# Patient Record
Sex: Female | Born: 1950 | Race: White | Hispanic: No | Marital: Single | Smoking: Never smoker
Health system: Southern US, Community
[De-identification: ages and names within clinical notes are randomized; demographics above are authoritative.]

## PROBLEM LIST (undated history)

## (undated) DIAGNOSIS — C801 Malignant (primary) neoplasm, unspecified: Secondary | ICD-10-CM

## (undated) DIAGNOSIS — M549 Dorsalgia, unspecified: Secondary | ICD-10-CM

## (undated) DIAGNOSIS — I10 Essential (primary) hypertension: Secondary | ICD-10-CM

## (undated) DIAGNOSIS — H9190 Unspecified hearing loss, unspecified ear: Secondary | ICD-10-CM

## (undated) DIAGNOSIS — G8929 Other chronic pain: Secondary | ICD-10-CM

## (undated) DIAGNOSIS — F419 Anxiety disorder, unspecified: Secondary | ICD-10-CM

## (undated) HISTORY — PX: BREAST BIOPSY: SHX20

## (undated) HISTORY — DX: Anxiety disorder, unspecified: F41.9

## (undated) HISTORY — DX: Essential (primary) hypertension: I10

## (undated) HISTORY — DX: Other chronic pain: G89.29

## (undated) HISTORY — PX: BREAST EXCISIONAL BIOPSY: SUR124

## (undated) HISTORY — DX: Dorsalgia, unspecified: M54.9

## (undated) HISTORY — DX: Malignant (primary) neoplasm, unspecified: C80.1

## (undated) HISTORY — PX: BREAST LUMPECTOMY: SHX2

---

## 1991-01-23 DIAGNOSIS — C801 Malignant (primary) neoplasm, unspecified: Secondary | ICD-10-CM

## 1991-01-23 DIAGNOSIS — G8929 Other chronic pain: Secondary | ICD-10-CM

## 1991-01-23 HISTORY — DX: Malignant (primary) neoplasm, unspecified: C80.1

## 1991-01-23 HISTORY — DX: Other chronic pain: G89.29

## 1998-05-10 ENCOUNTER — Other Ambulatory Visit: Admission: RE | Admit: 1998-05-10 | Discharge: 1998-05-10 | Payer: Self-pay | Admitting: *Deleted

## 1999-02-07 ENCOUNTER — Encounter: Payer: Self-pay | Admitting: Gastroenterology

## 1999-02-07 ENCOUNTER — Encounter: Admission: RE | Admit: 1999-02-07 | Discharge: 1999-02-07 | Payer: Self-pay | Admitting: Gastroenterology

## 2003-12-09 ENCOUNTER — Encounter (INDEPENDENT_AMBULATORY_CARE_PROVIDER_SITE_OTHER): Payer: Self-pay | Admitting: *Deleted

## 2003-12-09 ENCOUNTER — Ambulatory Visit (HOSPITAL_COMMUNITY): Admission: RE | Admit: 2003-12-09 | Discharge: 2003-12-09 | Payer: Self-pay | Admitting: Gastroenterology

## 2004-11-17 ENCOUNTER — Encounter: Admission: RE | Admit: 2004-11-17 | Discharge: 2005-01-21 | Payer: Self-pay | Admitting: Family Medicine

## 2008-08-20 ENCOUNTER — Other Ambulatory Visit: Admission: RE | Admit: 2008-08-20 | Discharge: 2008-08-20 | Payer: Self-pay | Admitting: Family Medicine

## 2010-04-04 ENCOUNTER — Other Ambulatory Visit: Payer: Self-pay | Admitting: Otolaryngology

## 2010-04-04 DIAGNOSIS — R42 Dizziness and giddiness: Secondary | ICD-10-CM

## 2010-06-09 NOTE — Op Note (Signed)
NAMEADDISON, Debbie [place]                  ACCOUNT NO.:  [id number]   MEDICAL RECORD NO.:  [id number]          PATIENT TYPE:  AMB   LOCATION:  ENDO                         FACILITY:  [facility]   PHYSICIAN:  Petra Kuba, M.D.    DATE OF BIRTH:  10/02/1950   DATE OF PROCEDURE:  12/09/2003  DATE OF DISCHARGE:                                 OPERATIVE REPORT   PROCEDURE:  Esophagogastroduodenoscopy.   INDICATIONS:  Upper tract symptoms.  Consent was signed after risks,  benefits, methods, and options thoroughly discussed multiple times in the  past.   Additional medicines for this procedure:  Demerol 10 mg, Versed 3 mg.   PROCEDURE:  The video endoscope was inserted by direct vision.  Proximal and  midesophagus were normal.  In the mid-distal esophagus was a small hiatal  hernia with a widely patent ring.  The scope passed into the stomach and  advanced through the antrum, pertinent for some minimal antritis, through a  normal pylorus, into a normal duodenal bulb and around the C-loop to a  normal second portion of the duodenum.  The scope was withdrawn back to the  bulb and a good look there ruled out ulcers in that location.  The scope was  withdrawn back to the stomach and retroflexed.  High in the cardia the  hiatal hernia was confirmed.  The angularis, fundus, lesser and greater  curve were normal on retroflex visualization.  Straight visualization in the  stomach revealed some minimal antritis and gastritis as mentioned above but  no other abnormalities.  Air was suctioned and the scope slowly withdrawn.  Again a good look at the esophagus confirmed the above findings.  The scope  was removed.  The patient tolerated the procedure well.  There was no  obvious immediate complication.   ENDOSCOPIC DIAGNOSES:  1.  Small hiatal hernia with a widely patent ring.  2.  Minimal gastritis and antritis.  3.  Otherwise normal EGD.   PLAN:  H2 blockers or pump inhibitors.  Redilation p.r.n.  Happy  to see back  p.r.n.  Otherwise, return care to Dr. Idell Pickles for the customary health  screening and maintenance to include yearly rectals and guaiacs.      MEM/MEDQ  D:  12/09/2003  T:  12/09/2003  Job:  [id number]   cc:   Dellis Anes. Idell Pickles, M.D.  [address]  [place]  [place] [address]  Fax: [phone]

## 2010-06-09 NOTE — Op Note (Signed)
NAMESHALOM, WARE                  ACCOUNT NO.:  [id number]   MEDICAL RECORD NO.:  [id number]          PATIENT TYPE:  AMB   LOCATION:  ENDO                         FACILITY:  [facility]   PHYSICIAN:  Petra Kuba, M.D.    DATE OF BIRTH:  10-Jun-1950   DATE OF PROCEDURE:  12/09/2003  DATE OF DISCHARGE:                                 OPERATIVE REPORT   PROCEDURE:  Colonoscopy.   INDICATIONS:  Patient with history of breast cancer, due for colonic  screening.  Consent was signed after risks, benefits, methods, and options  thoroughly discussed in the office on multiple occasions.   MEDICINES USED:  Demerol 90 mg, Versed 9 mg.   PROCEDURE:  Rectal inspection was pertinent for external hemorrhoids.  Digital exam was negative.  The video pediatric adjustable colonoscope was  inserted and with abdominal pressure, despite a tortuous sigmoid, was able  to be advanced to the cecum.  No position change was needed.  No abnormality  but left-sided tics were seen on insertion.  The cecum was identified by the  appendiceal orifice and the ileocecal valve.  In fact, the scope was  inserted a short way into the terminal ileum, which was normal.  Photo  documentation was obtained.  The scope was slowly withdrawn.  Prep was  adequate.  There was some liquid stool that required washing and suctioning,  but on slow withdrawal through the colon three questionable polyps versus  fold edema were seen and were all cold biopsied and put in separate  containers.  These were in the ascending, midtransverse, and proximal  sigmoid.  None were worrisome-looking.  All were probably just scope trauma  or prep-induced edema.  Other than the left-sided diverticula, no other  abnormalities were seen as we slowly withdrew back to the rectum.  Anorectal  pull-through and retroflexion confirmed some small hemorrhoids.  The scope  was straightened, air was suctioned, and the scope removed.  The patient  tolerated the  procedure well.  There was no obvious immediate complication.   ENDOSCOPIC DIAGNOSES:  1.  Internal-external small hemorrhoids.  2.  Left-sided diverticula.  3.  Tortuous sigmoid.  4.  Three questionable edematous folds, took biopsies to rule out polyps, in      the ascending, transverse, and      sigmoid.  5.  Otherwise within normal limits to the terminal ileum.   PLAN:  Await pathology to determine future colonic screening.  Continue  workup with an EGD.      MEM/MEDQ  D:  12/09/2003  T:  12/09/2003  Job:  [id number]   cc:   Dellis Anes. Idell Pickles, M.D.  [address]  [place]  [place] [address]  Fax: [phone]

## 2010-06-22 ENCOUNTER — Other Ambulatory Visit: Payer: Self-pay | Admitting: Family Medicine

## 2010-06-22 ENCOUNTER — Other Ambulatory Visit (HOSPITAL_BASED_OUTPATIENT_CLINIC_OR_DEPARTMENT_OTHER): Payer: Self-pay | Admitting: Family Medicine

## 2010-06-22 DIAGNOSIS — Z1231 Encounter for screening mammogram for malignant neoplasm of breast: Secondary | ICD-10-CM

## 2010-07-10 ENCOUNTER — Ambulatory Visit (HOSPITAL_BASED_OUTPATIENT_CLINIC_OR_DEPARTMENT_OTHER): Payer: Self-pay

## 2010-07-11 ENCOUNTER — Ambulatory Visit: Payer: Self-pay

## 2010-07-19 ENCOUNTER — Ambulatory Visit (HOSPITAL_BASED_OUTPATIENT_CLINIC_OR_DEPARTMENT_OTHER)
Admission: RE | Admit: 2010-07-19 | Discharge: 2010-07-19 | Disposition: A | Discharge status: Home / Self Care | Payer: Medicare Other | Source: Ambulatory Visit | Attending: Family Medicine | Admitting: Family Medicine

## 2010-07-19 DIAGNOSIS — Z1231 Encounter for screening mammogram for malignant neoplasm of breast: Secondary | ICD-10-CM | POA: Insufficient documentation

## 2011-08-01 ENCOUNTER — Other Ambulatory Visit (HOSPITAL_BASED_OUTPATIENT_CLINIC_OR_DEPARTMENT_OTHER): Payer: Self-pay | Admitting: Family Medicine

## 2011-08-01 DIAGNOSIS — Z1231 Encounter for screening mammogram for malignant neoplasm of breast: Secondary | ICD-10-CM

## 2011-08-16 ENCOUNTER — Ambulatory Visit (HOSPITAL_BASED_OUTPATIENT_CLINIC_OR_DEPARTMENT_OTHER)
Admission: RE | Admit: 2011-08-16 | Discharge: 2011-08-16 | Disposition: A | Discharge status: Home / Self Care | Payer: Medicare Other | Source: Ambulatory Visit | Attending: Family Medicine | Admitting: Family Medicine

## 2011-08-16 DIAGNOSIS — Z1231 Encounter for screening mammogram for malignant neoplasm of breast: Secondary | ICD-10-CM | POA: Insufficient documentation

## 2012-07-22 ENCOUNTER — Other Ambulatory Visit (HOSPITAL_BASED_OUTPATIENT_CLINIC_OR_DEPARTMENT_OTHER): Payer: Self-pay | Admitting: Family Medicine

## 2012-07-22 DIAGNOSIS — Z1231 Encounter for screening mammogram for malignant neoplasm of breast: Secondary | ICD-10-CM

## 2012-08-18 ENCOUNTER — Ambulatory Visit (HOSPITAL_BASED_OUTPATIENT_CLINIC_OR_DEPARTMENT_OTHER)
Admission: RE | Admit: 2012-08-18 | Discharge: 2012-08-18 | Disposition: A | Discharge status: Home / Self Care | Payer: Medicare Other | Source: Ambulatory Visit | Attending: Family Medicine | Admitting: Family Medicine

## 2012-08-18 DIAGNOSIS — Z1231 Encounter for screening mammogram for malignant neoplasm of breast: Secondary | ICD-10-CM

## 2013-07-17 ENCOUNTER — Other Ambulatory Visit (HOSPITAL_BASED_OUTPATIENT_CLINIC_OR_DEPARTMENT_OTHER): Payer: Self-pay | Admitting: Family Medicine

## 2013-07-17 DIAGNOSIS — Z1231 Encounter for screening mammogram for malignant neoplasm of breast: Secondary | ICD-10-CM

## 2013-08-24 ENCOUNTER — Ambulatory Visit (HOSPITAL_BASED_OUTPATIENT_CLINIC_OR_DEPARTMENT_OTHER)
Admission: RE | Admit: 2013-08-24 | Discharge: 2013-08-24 | Disposition: A | Discharge status: Home / Self Care | Payer: Medicare Other | Source: Ambulatory Visit | Attending: Family Medicine | Admitting: Family Medicine

## 2013-08-24 DIAGNOSIS — Z1231 Encounter for screening mammogram for malignant neoplasm of breast: Secondary | ICD-10-CM | POA: Insufficient documentation

## 2014-06-18 ENCOUNTER — Other Ambulatory Visit: Payer: Self-pay | Admitting: Family Medicine

## 2014-06-18 ENCOUNTER — Ambulatory Visit
Admission: RE | Admit: 2014-06-18 | Discharge: 2014-06-18 | Disposition: A | Discharge status: Home / Self Care | Payer: Medicare Other | Source: Ambulatory Visit | Attending: Family Medicine | Admitting: Family Medicine

## 2014-06-18 DIAGNOSIS — M25512 Pain in left shoulder: Secondary | ICD-10-CM

## 2014-07-29 ENCOUNTER — Ambulatory Visit: Payer: Medicare Other | Attending: Family Medicine | Admitting: Rehabilitation

## 2014-07-29 DIAGNOSIS — R29898 Other symptoms and signs involving the musculoskeletal system: Secondary | ICD-10-CM | POA: Insufficient documentation

## 2014-07-29 DIAGNOSIS — M542 Cervicalgia: Secondary | ICD-10-CM

## 2014-07-29 DIAGNOSIS — M5010 Cervical disc disorder with radiculopathy, unspecified cervical region: Secondary | ICD-10-CM

## 2014-07-29 NOTE — Therapy (Signed)
[address] [place], [place], [address] Phone: [phone]   Fax:  [phone]  Physical Therapy Evaluation  Patient Details  Name: Debbie [place] MRN: [id number] Date of Birth: 09/21/1950 Referring Provider:  Hulan Fess, MD  Encounter Date: 07/29/2014      PT End of Session - 07/29/14 1008    Visit Number 1   PT Start Time 1010   PT Stop Time 1110   PT Time Calculation (min) 60 min      No past medical history on file.  No past surgical history on file.  There were no vitals filed for this visit.  Visit Diagnosis:  Cervical disc disorder with radiculopathy of cervical region - Plan: PT plan of care cert/re-cert  Cervicalgia - Plan: PT plan of care cert/re-cert  Decreased range of motion of neck - Plan: PT plan of care cert/re-cert      Subjective Assessment - 07/29/14 1003    Subjective L neck pain since 11/15, no apparent reason/injury.   Normal cspine xray w/ normal disc space height.   Dx w/ cervical radiculopathy.    Sees chiropractor 2x/month for manipulation, stretching   Patient is accompained by: Interpreter   Pertinent History deafness, osteopenia w/ -2 Tscore Lspine/hip, h/o R breast CA s/p mastectomy1993 and normal breast MRI 2009, anxiety, h/o cardiac dysrhythmia   Diagnostic tests negative xrays   Currently in Pain? Yes   Pain Score 3    Pain Location Neck   Pain Orientation Left   Pain Descriptors / Indicators Pressure   Pain Radiating Towards L upper trap   Pain Onset More than a month ago   Pain Frequency Intermittent   Aggravating Factors  yardwork, any lifting   Pain Relieving Factors lying down   Effect of Pain on Daily Activities any ADL requiring lifting or arm overhead            [facility] PT Assessment - 07/29/14 0001    Assessment   Medical Diagnosis cervical radiculopathy   Onset Date/Surgical Date 12/07/14   Balance Screen   Has the patient fallen in the past  6 months No   ROM / Strength   AROM / PROM / Strength AROM;Strength   AROM   AROM Assessment Site Cervical   Cervical Flexion 100%   Cervical Extension 50%   Cervical - Right Side Bend 60%   Cervical - Left Side Bend 40%   Cervical - Right Rotation 100%   Cervical - Left Rotation 100%   Strength   Overall Strength Comments B shoulder:  4/5 flex, Abd, ER, HABD   Special Tests    Special Tests Cervical   Cervical Tests Spurling's   Spurling's   Comment very mild positive on L, neg R                   [facility] Adult PT Treatment/Exercise - 07/29/14 0001    Modalities   Modalities Ultrasound   Ultrasound   Ultrasound Location L cspine   Ultrasound Parameters 100 x 1 mhz x 1.5 w/cm2 x 10'   Ultrasound Goals Pain                  PT Short Term Goals - 07/29/14 1008    PT SHORT TERM GOAL #1   Title independent with HEP   Baseline 4   Period Weeks   Status New   PT SHORT TERM GOAL #2   Title  cervical spine ROM of 75% all directions   Time 4   Period Weeks   Status New           PT Long Term Goals - 08-25-2014 1008    PT LONG TERM GOAL #1   Title 2/10 pain in neck at worst with no radiculopathy   Time 8   Period Weeks   Status New   PT LONG TERM GOAL #2   Title 75% subjective improvement in symptoms   Time 8   Period Weeks   Status New               Plan - 25-Aug-2014 1125    Clinical Impression Statement Pt. only has pain in L upper trap area.    She is not tender to palpate over the UT, but does have palpable trigger pts over suboccipital muscles and L posterior scalene mid belly.   Severe forward head/rounded shoulder, some thoracic kyphosis.   She postures habitually in L sidebending/R rotation upper cervical spine.  She has chronic LBP/scoliosis/osteopenia per her report.  She is actually very interested in a spinomed type postural brace so we will check on referral to [facility] for this.   Pt will benefit from skilled therapeutic  intervention in order to improve on the following deficits Pain;Postural dysfunction;Decreased range of motion;Decreased strength   Rehab Potential Good   PT Frequency 2x / week   PT Duration 8 weeks   PT Treatment/Interventions Electrical Stimulation;Cryotherapy;Moist Heat;Traction;Ultrasound;Therapeutic exercise;Patient/family education;Manual techniques;Dry needling;Taping   PT Next Visit Plan scap/cervical retraction strength,  Combo, portable cervical traction unit, subocc release MFR, R spine sideglide mobilizations          G-Codes - 08/25/14 1130    Functional Assessment Tool Used pt can't complete       Problem List There are no active problems to display for this patient.   Volney American, PT 08/25/2014, 11:31 AM  [address] [address] [place], [place], [address] Phone: [phone]   Fax:  [phone]

## 2014-08-02 ENCOUNTER — Ambulatory Visit: Payer: Medicare Other | Admitting: Rehabilitation

## 2014-08-02 DIAGNOSIS — R29898 Other symptoms and signs involving the musculoskeletal system: Secondary | ICD-10-CM

## 2014-08-02 DIAGNOSIS — M5010 Cervical disc disorder with radiculopathy, unspecified cervical region: Secondary | ICD-10-CM | POA: Diagnosis not present

## 2014-08-02 DIAGNOSIS — M542 Cervicalgia: Secondary | ICD-10-CM

## 2014-08-02 NOTE — Therapy (Signed)
[place], [place], [address] Phone: [phone]   Fax:  [phone]  Physical Therapy Treatment  Patient Details  Name: Debbie [place] MRN: [id number] Date of Birth: 1950-07-05 Referring Provider:  Hulan Fess, MD  Encounter Date: 08/02/2014      PT End of Session - 08/02/14 0839    Visit Number 2   PT Start Time 0802   PT Stop Time 0902   PT Time Calculation (min) 60 min      No past medical history on file.  No past surgical history on file.  There were no vitals filed for this visit.  Visit Diagnosis:  Cervicalgia  Decreased range of motion of neck      Subjective Assessment - 08/02/14 0842    Subjective States feels about the same.   Wants to know if ok to mow and weed eat.   Currently in Pain? Yes   Pain Score 3    Pain Location Neck   Pain Orientation Left   Pain Descriptors / Indicators Aching;Sore                         [facility] Adult PT Treatment/Exercise - 08/02/14 0001    Modalities   Modalities Electrical Stimulation;Moist Heat;Ultrasound   Moist Heat Therapy   Number Minutes Moist Heat 15 Minutes   Moist Heat Location Cervical   Electrical Stimulation   Electrical Stimulation Location L cervical/UT   Electrical Stimulation Parameters premod x 8 mA   Electrical Stimulation Goals Pain   Ultrasound   Ultrasound Location L cspine   Ultrasound Parameters Combo x 100% x 39mhz x 1.5 w/cm2 x 90 mV Hivolt x 10'   Ultrasound Goals Pain   Manual Therapy   Manual Therapy Joint mobilization;Myofascial release   Manual therapy comments subocc rls, trigger pt release UT and post scalene   Joint Mobilization grade 3 cervical bilat sideglides C2-7 x 30 ea;   L rotation Gr 3 x 30 ea level,                PT Education - 08/02/14 0842    Education provided Yes   Education Details pain is her guide on all activity   Person(s) Educated Patient   Methods  Explanation   Comprehension Verbalized understanding          PT Short Term Goals - 07/29/14 1008    PT SHORT TERM GOAL #1   Title independent with HEP   Baseline 4   Period Weeks   Status New   PT SHORT TERM GOAL #2   Title cervical spine ROM of 75% all directions   Time 4   Period Weeks   Status New           PT Long Term Goals - 07/29/14 1008    PT LONG TERM GOAL #1   Title 2/10 pain in neck at worst with no radiculopathy   Time 8   Period Weeks   Status New   PT LONG TERM GOAL #2   Title 75% subjective improvement in symptoms   Time 8   Period Weeks   Status New               Plan - 08/02/14 9518    Clinical Impression Statement Pt. received suboccipital release, trigger point release, and cervical SB MFR stretch.   Tolerates well.    She is very interested  in a supportive TLSO for her kyphoscoliosis.   Called Reggie @ [employer] re: [employer].    They will need MD Order and pt. to call and schedule   PT Next Visit Plan continue combo, MFR.   Add UBE and TB scap.  Send order for TLSO to MD for signature, and then to [employer] and advise pt. to make apptmt.         Problem List There are no active problems to display for this patient.   Volney American, PT 08/02/2014, 12:17 PM  [address] [place], [place], [address] Phone: [phone]   Fax:  [phone]

## 2014-08-04 ENCOUNTER — Ambulatory Visit: Payer: Medicare Other | Admitting: Rehabilitation

## 2014-08-04 DIAGNOSIS — M5010 Cervical disc disorder with radiculopathy, unspecified cervical region: Secondary | ICD-10-CM | POA: Diagnosis not present

## 2014-08-04 DIAGNOSIS — R29898 Other symptoms and signs involving the musculoskeletal system: Secondary | ICD-10-CM

## 2014-08-04 DIAGNOSIS — M542 Cervicalgia: Secondary | ICD-10-CM

## 2014-08-04 NOTE — Therapy (Signed)
[place], [place], [address] Phone: [phone]   Fax:  [phone]  Physical Therapy Treatment  Patient Details  Name: Debbie [place] MRN: [id number] Date of Birth: 1950-05-24 Referring Provider:  Hulan Fess, MD  Encounter Date: 08/04/2014      PT End of Session - 08/04/14 0804    Visit Number 3   PT Start Time 7782   PT Stop Time 0900   PT Time Calculation (min) 65 min      No past medical history on file.  No past surgical history on file.  There were no vitals filed for this visit.  Visit Diagnosis:  Cervicalgia  Decreased range of motion of neck      Subjective Assessment - 08/04/14 1124    Subjective States feels about the same regarding her neck pain, but that feels better after therapy   Currently in Pain? Yes   Pain Score 3    Pain Location Neck   Pain Orientation Left   Pain Descriptors / Indicators Aching                         [facility] Adult PT Treatment/Exercise - 08/04/14 0001    Exercises   Exercises Neck   Neck Exercises: Machines for Strengthening   UBE (Upper Arm Bike) 6' L1 back only  6' L1 back only   Neck Exercises: Theraband   Scapula Retraction Limitations x 20 in supine   Rows Limitations 15# x 20   Shoulder External Rotation Limitations supine yell TB x 20 BUE   Horizontal ABduction Limitations supine yell TB x 20 BUE   Neck Exercises: Supine   Neck Retraction Limitations x20   Other Supine Exercise scapular depression x 20 bue   Modalities   Modalities Traction   Ultrasound   Ultrasound Location L cspine   Ultrasound Parameters Combo x 100% x 1 mhz x 1.5 w/cm2 x 10'   Ultrasound Goals Pain   Traction   Type of Traction Cervical   Max (lbs) 20   Hold Time static   Time 12                  PT Short Term Goals - 07/29/14 1008    PT SHORT TERM GOAL #1   Title independent with HEP   Baseline 4   Period Weeks   Status New   PT SHORT TERM GOAL #2   Title cervical spine ROM of 75% all directions   Time 4   Period Weeks   Status New           PT Long Term Goals - 07/29/14 1008    PT LONG TERM GOAL #1   Title 2/10 pain in neck at worst with no radiculopathy   Time 8   Period Weeks   Status New   PT LONG TERM GOAL #2   Title 75% subjective improvement in symptoms   Time 8   Period Weeks   Status New               Plan - 08/04/14 1129    Clinical Impression Statement Lots of time spent reviewing HEP and making sure patient is doing correctly.   Pt. has lots of questions and lots of extra time required due to interpretation via sign langauge interpreter.   We have faxed an order for off the shelf TLSO to her primary MD to  sign and return to [place].   We will send to [employer] once we get it back and pt. will call to schedule an apptmt .   PT Next Visit Plan gym ex, sideglide Jt mobs,        Problem List There are no active problems to display for this patient.   Volney American, PT 08/04/2014, 11:33 AM  [address] [address] [place], [place], [address] Phone: [phone]   Fax:  [phone]

## 2014-08-09 ENCOUNTER — Encounter: Payer: Self-pay | Admitting: Physical Therapy

## 2014-08-09 ENCOUNTER — Ambulatory Visit: Payer: Medicare Other | Admitting: Physical Therapy

## 2014-08-09 DIAGNOSIS — M542 Cervicalgia: Secondary | ICD-10-CM

## 2014-08-09 DIAGNOSIS — M5010 Cervical disc disorder with radiculopathy, unspecified cervical region: Secondary | ICD-10-CM | POA: Diagnosis not present

## 2014-08-09 DIAGNOSIS — R29898 Other symptoms and signs involving the musculoskeletal system: Secondary | ICD-10-CM

## 2014-08-09 NOTE — Therapy (Signed)
[place], [place], [address] Phone: [phone]   Fax:  [phone]  Physical Therapy Treatment  Patient Details  Name: Matayah Reyburn MRN: [id number] Date of Birth: 07/01/1950 Referring Provider:  Hulan Fess, MD  Encounter Date: 08/09/2014      PT End of Session - 08/09/14 1125    Visit Number 4   PT Start Time 2706   PT Stop Time 2376   PT Time Calculation (min) 47 min   Activity Tolerance Patient tolerated treatment well   Behavior During Therapy [facility] for tasks assessed/performed      History reviewed. No pertinent past medical history.  History reviewed. No pertinent past surgical history.  There were no vitals filed for this visit.  Visit Diagnosis:  Cervicalgia  Decreased range of motion of neck  Cervical disc disorder with radiculopathy of cervical region      Subjective Assessment - 08/09/14 0931    Subjective Pt states she is doing ok.  Her neck is a little better and continuing to improve.     Patient is accompained by: Interpreter   Currently in Pain? Yes   Pain Score 1    Pain Location Neck   Pain Orientation Left   Pain Descriptors / Indicators Tightness   Pain Type Chronic pain   Pain Radiating Towards Left upper trap   Pain Onset More than a month ago                         [facility] Adult PT Treatment/Exercise - 08/09/14 0001    Neck Exercises: Machines for Strengthening   UBE (Upper Arm Bike) 6' L3 back only   Neck Exercises: Theraband   Rows Limitations 15# x 20  2 sets   Shoulder External Rotation Limitations standing, yellow TB x 20   Horizontal ABduction Limitations --   Neck Exercises: Standing   Other Standing Exercises Shoulder BUE ER, yellow theraband   Neck Exercises: Seated   Neck Retraction 10 reps   2 sets   Shoulder Shrugs 10 reps  shrugs to depression   Shoulder Rolls 10 reps;Forwards;Backwards      Shoulder ABduction 20  reps   Shoulder Abduction Limitations yellow theraband   Neck Exercises: Supine   Neck Retraction Limitations x20  seated   Other Supine Exercise thoracic opening on half foam roller with slight manual over pressure on shoulders   Modalities   Modalities Traction   Traction   Type of Traction Cervical   Max (lbs) 4-14  increasing as time went on   Hold Time incremental increases   Time 12   Manual Therapy   Manual Therapy Joint mobilization;Soft tissue mobilization   Manual therapy comments UT, scalene trp release   Joint Mobilization grade 2 cervical bilateral lateral glides c2-7   Soft tissue mobilization PROM cervical L/R rotation                PT Education - 08/09/14 1124    Education provided Yes   Education Details spoke with the patient about posture, using towels rolls while supine to encourage thoracic opening   Person(s) Educated Patient   Methods Explanation;Demonstration;Tactile cues;Verbal cues;Handout   Comprehension Verbalized understanding;Returned demonstration          PT Short Term Goals - 07/29/14 1008    PT SHORT TERM GOAL #1   Title independent with HEP   Baseline 4   Period  Weeks   Status New   PT SHORT TERM GOAL #2   Title cervical spine ROM of 75% all directions   Time 4   Period Weeks   Status New           PT Long Term Goals - 07/29/14 1008    PT LONG TERM GOAL #1   Title 2/10 pain in neck at worst with no radiculopathy   Time 8   Period Weeks   Status New   PT LONG TERM GOAL #2   Title 75% subjective improvement in symptoms   Time 8   Period Weeks   Status New               Plan - 08/09/14 1126    Clinical Impression Statement Pt did well with treatment. Pt needs cueing on most exercises about posture.  We continued posture education and suggested towel roll for the back of her chair as well as laying supine on  towel rolls at home.  Pt had trigger points in L UT and has a hard time relaxing during PROM and  trp massage.     Pt will benefit from skilled therapeutic intervention in order to improve on the following deficits Pain;Postural dysfunction;Decreased range of motion;Decreased strength   Rehab Potential Good   PT Frequency 2x / week   PT Duration 8 weeks   PT Treatment/Interventions Electrical Stimulation;Cryotherapy;Moist Heat;Traction;Ultrasound;Therapeutic exercise;Patient/family education;Manual techniques;Dry needling;Taping   PT Next Visit Plan continue stregthening exercises, joint mobes, posture education, PROM   Consulted and Agree with Plan of Care Patient        Problem List There are no active problems to display for this patient.   Sumner Boast., PT 08/09/2014, 11:35 AM  [address] [place], [place], [address] Phone: [phone]   Fax:  [phone]

## 2014-08-11 ENCOUNTER — Encounter: Payer: Self-pay | Admitting: Physical Therapy

## 2014-08-11 ENCOUNTER — Ambulatory Visit: Payer: Medicare Other | Admitting: Physical Therapy

## 2014-08-11 DIAGNOSIS — M5010 Cervical disc disorder with radiculopathy, unspecified cervical region: Secondary | ICD-10-CM | POA: Diagnosis not present

## 2014-08-11 DIAGNOSIS — M542 Cervicalgia: Secondary | ICD-10-CM

## 2014-08-11 DIAGNOSIS — R29898 Other symptoms and signs involving the musculoskeletal system: Secondary | ICD-10-CM

## 2014-08-11 NOTE — Therapy (Signed)
[place], [place], [address] Phone: [phone]   Fax:  [phone]  Physical Therapy Treatment  Patient Details  Name: Debbie [place] MRN: [id number] Date of Birth: 08-Aug-1950 Referring Provider:  Hulan Fess, MD  Encounter Date: 08/11/2014      PT End of Session - 08/11/14 1012    Visit Number 5   PT Start Time 0933   PT Stop Time 6283   PT Time Calculation (min) 54 min      History reviewed. No pertinent past medical history.  History reviewed. No pertinent past surgical history.  There were no vitals filed for this visit.  Visit Diagnosis:  Decreased range of motion of neck  Cervical disc disorder with radiculopathy of cervical region  Cervicalgia      Subjective Assessment - 08/11/14 0933    Subjective Pt enters clinic with contour pillow. Pt reports that she is doing fine and she still notices improvement with neck   Patient is accompained by: Interpreter   Pertinent History deafness, osteopenia w/ -2 Tscore Lspine/hip, h/o R breast CA s/p mastectomy1993 and normal breast MRI 2009, anxiety, h/o cardiac dysrhythmia   Diagnostic tests negative xrays   Currently in Pain? No/denies   Pain Score 0-No pain   Pain Location Neck   Pain Orientation Left                         [facility] Adult PT Treatment/Exercise - 08/11/14 0001    Neck Exercises: Machines for Strengthening   UBE (Upper Arm Bike) 8' L3    Neck Exercises: Theraband   Rows Limitations 20# 2x 10   Shoulder External Rotation Limitations standing, yellow TB 2x 10   Other Theraband Exercises Lat pull downs #15 2X10    Neck Exercises: Standing   Other Standing Exercises Standing shoulder Extensions Yellow TB x20   Other Standing Exercises shoulder IR X20, Standing shoulder shrugs #5 20 reps    Neck Exercises: Seated   Upper Extremity D2 Theraband;Flexion;Extension;20 reps   Theraband Level (UE D2) Level 1  (Yellow)   Traction   Type of Traction Cervical   Max (lbs) 4-14   Hold Time incremental increases   Time 15                  PT Short Term Goals - 07/29/14 1008    PT SHORT TERM GOAL #1   Title independent with HEP   Baseline 4   Period Weeks   Status New   PT SHORT TERM GOAL #2   Title cervical spine ROM of 75% all directions   Time 4   Period Weeks   Status New           PT Long Term Goals - 08/11/14 1014    PT LONG TERM GOAL #1   Title 2/10 pain in neck at worst with no radiculopathy   Status On-going               Plan - 08/11/14 1013    Clinical Impression Statement Pt continues to require cueing with posture. Pt tolerated therapeutic exercises well. Pt educated on proper work Immunologist and verbalize understanding with interpreter    Pt will benefit from skilled therapeutic intervention in order to improve on the following deficits Pain;Postural dysfunction;Decreased range of motion;Decreased strength   Rehab Potential Good   PT Frequency 2x / week   PT Duration  8 weeks   PT Treatment/Interventions Electrical Stimulation;Cryotherapy;Moist Heat;Traction;Ultrasound;Therapeutic exercise;Patient/family education;Manual techniques;Dry needling;Taping   PT Next Visit Plan continue strengthening exercises, joint mobes, posture education, PROM        Problem List There are no active problems to display for this patient.   Scot Jun, PTA 08/11/2014, 10:17 AM  [address] [place], [place], [address] Phone: [phone]   Fax:  [phone]

## 2014-08-17 ENCOUNTER — Encounter: Payer: Self-pay | Admitting: Physical Therapy

## 2014-08-17 ENCOUNTER — Ambulatory Visit: Payer: Medicare Other | Admitting: Physical Therapy

## 2014-08-17 DIAGNOSIS — M5010 Cervical disc disorder with radiculopathy, unspecified cervical region: Secondary | ICD-10-CM | POA: Diagnosis not present

## 2014-08-17 DIAGNOSIS — M542 Cervicalgia: Secondary | ICD-10-CM

## 2014-08-17 DIAGNOSIS — R29898 Other symptoms and signs involving the musculoskeletal system: Secondary | ICD-10-CM

## 2014-08-17 NOTE — Patient Instructions (Addendum)
(  Clinic) PNF: D1 Extension - Unilateral   Opposite side toward pulley, right arm up, across body, thumb up, pull arm down across body, rotating to thumb down. Follow hand with head and eyes. Repeat ____ times per set. Do ____ sets per session. Do ____ sessions per week. Use ____ lb weights.  Copyright  VHI. All rights reserved.  (Clinic) PNF: D2 Flexion - Unilateral   Opposite side toward pulley, right arm down, across body, thumb down, pull arm up and out, rotating to thumb up. Follow hand with head and eyes. Repeat ____ times per set. Do ____ sets per session. Do ____ sessions per week. Use ____ lb weights.  Copyright  VHI. All rights reserved.

## 2014-08-17 NOTE — Therapy (Signed)
[place], [place], [address] Phone: [phone]   Fax:  [phone]  Physical Therapy Treatment  Patient Details  Name: Debbie Sanders MRN: [id number] Date of Birth: [birth year] Referring Provider:  Hulan Fess, MD  Encounter Date: 08/17/2014      PT End of Session - 08/17/14 1004    Visit Number 6   PT Start Time 0927   PT Stop Time 6659   PT Time Calculation (min) 47 min   Activity Tolerance Patient tolerated treatment well   Behavior During Therapy [facility] for tasks assessed/performed      History reviewed. No pertinent past medical history.  History reviewed. No pertinent past surgical history.  There were no vitals filed for this visit.  Visit Diagnosis:  Decreased range of motion of neck  Cervical disc disorder with radiculopathy of cervical region  Cervicalgia      Subjective Assessment - 08/17/14 0928    Subjective Pt states she is doing much better.  Denies pain in the neck/UT area.  Does state she has some shoulder tightness.     Patient is accompained by: Interpreter   Currently in Pain? No/denies                         [facility] Adult PT Treatment/Exercise - 08/17/14 0001    Neck Exercises: Machines for Strengthening   UBE (Upper Arm Bike) 8' L3    Neck Exercises: Theraband   Rows Limitations 20# 2x 10   Shoulder External Rotation Limitations standing, yellow TB 2x 10   Other Theraband Exercises Lat pull downs #15 2X10    Neck Exercises: Standing   Other Standing Exercises Standinf shoulder Extensions Red TB x20   Other Standing Exercises shoulder IR X 10-yellow TB, Standing shoulder shrugs #3 2x 10   Neck Exercises: Seated   UE D1 Weights (lbs) theraband-yellow-2 sets 10   Upper Extremity D2 Theraband;Flexion;Extension;20 reps   Theraband Level (UE D2) Level 1 (Yellow)   Modalities   Modalities Traction   Traction   Type of Traction Cervical   Max  (lbs) 4-14   Hold Time incremental increases   Time 15                PT Education - 08/17/14 1004    Education provided Yes   Education Details gave handout on shoulder exercises   Person(s) Educated Patient   Methods Explanation;Demonstration;Tactile cues;Handout   Comprehension Verbalized understanding;Returned demonstration          PT Short Term Goals - 07/29/14 1008    PT SHORT TERM GOAL #1   Title independent with HEP   Baseline 4   Period Weeks   Status New   PT SHORT TERM GOAL #2   Title cervical spine ROM of 75% all directions   Time 4   Period Weeks   Status New           PT Long Term Goals - 08/11/14 1014    PT LONG TERM GOAL #1   Title 2/10 pain in neck at worst with no radiculopathy   Status On-going               Plan - 08/17/14 1005    Clinical Impression Statement Pt is improving and states she has decreased pain in Ut/cervical area.  Pt purchased a second therapeutic pillow and that has helped.  Will discuss possible discharge in the  next couple visits if improvement continues.    Rehab Potential Good   PT Frequency 2x / week   PT Duration 8 weeks   PT Treatment/Interventions Electrical Stimulation;Cryotherapy;Moist Heat;Traction;Ultrasound;Therapeutic exercise;Patient/family education;Manual techniques;Dry needling;Taping   PT Next Visit Plan continue stregthening exercises, joint mobes, posture education, PROM   Consulted and Agree with Plan of Care Patient        Problem List There are no active problems to display for this patient.   Sumner Boast., PT 08/17/2014, 1:53 PM  [place] [address] [address] [place], [place], [address] Phone: [phone]   Fax:  [phone]

## 2014-08-19 ENCOUNTER — Encounter: Payer: Self-pay | Admitting: Physical Therapy

## 2014-08-19 ENCOUNTER — Ambulatory Visit: Payer: Medicare Other | Admitting: Physical Therapy

## 2014-08-19 DIAGNOSIS — R29898 Other symptoms and signs involving the musculoskeletal system: Secondary | ICD-10-CM

## 2014-08-19 DIAGNOSIS — M5010 Cervical disc disorder with radiculopathy, unspecified cervical region: Secondary | ICD-10-CM | POA: Diagnosis not present

## 2014-08-19 DIAGNOSIS — M542 Cervicalgia: Secondary | ICD-10-CM

## 2014-08-19 NOTE — Therapy (Signed)
[place], [place], [address] Phone: [phone]   Fax:  [phone]  Physical Therapy Treatment  Patient Details  Name: Debbie Sanders MRN: [id number] Date of Birth: [birth year] Referring Provider:  Hulan Fess, MD  Encounter Date: 08/19/2014      PT End of Session - 08/19/14 1007    Visit Number 7   PT Start Time 0927   PT Stop Time 5400   PT Time Calculation (min) 48 min   Activity Tolerance Patient tolerated treatment well   Behavior During Therapy [facility] for tasks assessed/performed      History reviewed. No pertinent past medical history.  History reviewed. No pertinent past surgical history.  There were no vitals filed for this visit.  Visit Diagnosis:  Decreased range of motion of neck  Cervical disc disorder with radiculopathy of cervical region  Cervicalgia      Subjective Assessment - 08/19/14 0927    Subjective Pt states she is doing much better, no neck pain, a little bit of shoulder tightness that is unchanged.  I have brace fitting on Tues. morning.     Patient is accompained by: Interpreter   Currently in Pain? No/denies                         [facility] Adult PT Treatment/Exercise - 08/19/14 0001    Neck Exercises: Machines for Strengthening   UBE (Upper Arm Bike) 8' L4   Neck Exercises: Theraband   Rows Limitations 20# 2x 10   Shoulder External Rotation Limitations standing, yellow TB 2x 10   Other Theraband Exercises Lat pull downs #15 2X10    Neck Exercises: Standing   Other Standing Exercises Standing shoulder Extensions green TB x20   Other Standing Exercises --   Neck Exercises: Seated   Neck Retraction --   Lumbar Exercises: Supine   Other Supine Lumbar Exercises pelvic tilts-2 x 10   Shoulder Exercises: Seated   Other Seated Exercises thoracic rotation 2 x 10 with cane   Other Seated Exercises thoracic manual straightening 2 x 10                 PT Education - 08/19/14 1006    Education provided Yes   Education Details instructed pt on thoracic rotation exercises with cane, supine/standing pelvic tilts   Person(s) Educated Patient   Methods Explanation;Demonstration;Tactile cues;Verbal cues   Comprehension Verbalized understanding;Returned demonstration          PT Short Term Goals - 07/29/14 1008    PT SHORT TERM GOAL #1   Title independent with HEP   Baseline 4   Period Weeks   Status New   PT SHORT TERM GOAL #2   Title cervical spine ROM of 75% all directions   Time 4   Period Weeks   Status New           PT Long Term Goals - 08/11/14 1014    PT LONG TERM GOAL #1   Title 2/10 pain in neck at worst with no radiculopathy   Status On-going               Plan - 08/19/14 1007    Clinical Impression Statement Pt improving each day, no complaints of neck pain.  Still has residual shoulder tightness but not bothersome to her too much.  Pt has increased lumbar lordosis and knee locking upon standing.  Instructed patient on  trying to have bend in knees when standing as well as doing pelvic titls supine and standing.  Discussed possible discharge from therapy next week.     Rehab Potential Good   PT Frequency 2x / week   PT Duration 8 weeks   PT Treatment/Interventions Electrical Stimulation;Cryotherapy;Moist Heat;Traction;Ultrasound;Therapeutic exercise;Patient/family education;Manual techniques;Dry needling;Taping   PT Next Visit Plan continue stregthening exercises, joint mobes, posture education, PROM   Consulted and Agree with Plan of Care Patient        Problem List There are no active problems to display for this patient.   Sumner Boast., PT 08/19/2014, 11:20 AM  [place], [place], [address] Phone: [phone]   Fax:  [phone]

## 2014-08-23 ENCOUNTER — Ambulatory Visit: Payer: Medicare Other | Attending: Family Medicine | Admitting: Physical Therapy

## 2014-08-23 ENCOUNTER — Encounter: Payer: Self-pay | Admitting: Physical Therapy

## 2014-08-23 DIAGNOSIS — M5010 Cervical disc disorder with radiculopathy, unspecified cervical region: Secondary | ICD-10-CM | POA: Diagnosis present

## 2014-08-23 DIAGNOSIS — R29898 Other symptoms and signs involving the musculoskeletal system: Secondary | ICD-10-CM | POA: Diagnosis present

## 2014-08-23 DIAGNOSIS — M542 Cervicalgia: Secondary | ICD-10-CM

## 2014-08-23 NOTE — Therapy (Signed)
[place], [place], [address] Phone: [phone]   Fax:  [phone]  Physical Therapy Treatment  Patient Details  Name: Debbie Sanders MRN: [id number] Date of Birth: 1950-04-03 Referring Provider:  Hulan Fess, MD  Encounter Date: 08/23/2014      PT End of Session - 08/23/14 0816    Visit Number 8   PT Start Time 0754   PT Stop Time 0840   PT Time Calculation (min) 46 min   Activity Tolerance Patient tolerated treatment well   Behavior During Therapy [facility] for tasks assessed/performed      History reviewed. No pertinent past medical history.  History reviewed. No pertinent past surgical history.  There were no vitals filed for this visit.  Visit Diagnosis:  Decreased range of motion of neck  Cervicalgia  Cervical disc disorder with radiculopathy of cervical region      Subjective Assessment - 08/23/14 0752    Subjective Pt states she has some shoulder soreness around the back (RC area), neck is feeling a lot better, I have my brace fitting tomorrow.     Currently in Pain? No/denies                         [facility] Adult PT Treatment/Exercise - 08/23/14 0001    Neck Exercises: Machines for Strengthening   UBE (Upper Arm Bike) 8' L5   Neck Exercises: Theraband   Rows Limitations 20# 2x 10   Other Theraband Exercises Lat pull downs #15 2X10    Neck Exercises: Standing   Other Standing Exercises Standing shoulder Extensions green TB 2x15   Other Standing Exercises shrugs/shoulder rolls 2 x 10 4#   Neck Exercises: Seated   Neck Retraction 10 reps  2 sets   Lumbar Exercises: Seated   Other Seated Lumbar Exercises pelvic tilts  2 x 10   Shoulder Exercises: Seated   Other Seated Exercises thoracic rotation 2 x 10 with cane   Other Seated Exercises thoracic manual straightening 2 x 10   Traction   Type of Traction Cervical   Max (lbs) 8   Hold Time static   Time 15                  PT Short Term Goals - 07/29/14 1008    PT SHORT TERM GOAL #1   Title independent with HEP   Baseline 4   Period Weeks   Status New   PT SHORT TERM GOAL #2   Title cervical spine ROM of 75% all directions   Time 4   Period Weeks   Status New           PT Long Term Goals - 08/11/14 1014    PT LONG TERM GOAL #1   Title 2/10 pain in neck at worst with no radiculopathy   Status On-going               Plan - 08/23/14 0816    Clinical Impression Statement Pt continues to deny pain in cervical/UT areas.  She does mention shoulder stiffness but has said that she has had shoulder problems for many years.  Pt has brace fitting on Tues. and will bring to the clinic on Wed. Plan to discharge Wed.         Problem List There are no active problems to display for this patient.   Sumner Boast., PT 08/23/2014, 8:59 AM  [facility]  [place], [place], [address] Phone: [phone]   Fax:  [phone]

## 2014-08-25 ENCOUNTER — Ambulatory Visit: Payer: Medicare Other | Admitting: Physical Therapy

## 2014-08-25 DIAGNOSIS — R29898 Other symptoms and signs involving the musculoskeletal system: Secondary | ICD-10-CM | POA: Diagnosis not present

## 2014-08-25 DIAGNOSIS — M5010 Cervical disc disorder with radiculopathy, unspecified cervical region: Secondary | ICD-10-CM

## 2014-08-25 DIAGNOSIS — M542 Cervicalgia: Secondary | ICD-10-CM

## 2014-08-25 NOTE — Patient Instructions (Signed)
Concentric Contraction With Pelvic Floor (Hook-Lying)   Lie with hips and knees bent. Slowly inhale, and then exhale. Pull navel toward spine and tighten pelvic floor. Relax. Repeat ___ times. Do ___ times a day.   Copyright  VHI. All rights reserved.

## 2014-08-25 NOTE — Therapy (Signed)
[place], [place], [address] Phone: [phone]   Fax:  [phone]  Physical Therapy Treatment  Patient Details  Name: Debbie Sanders MRN: [id number] Date of Birth: 1950/06/16 Referring Provider:  Hulan Fess, MD  Encounter Date: 08/25/2014      PT End of Session - 08/25/14 0824    Visit Number 9   PT Start Time 0177   PT Stop Time 0849   PT Time Calculation (min) 54 min   Behavior During Therapy [facility] for tasks assessed/performed      No past medical history on file.  No past surgical history on file.  There were no vitals filed for this visit.  Visit Diagnosis:  Decreased range of motion of neck  Cervicalgia  Cervical disc disorder with radiculopathy of cervical region      Subjective Assessment - 08/25/14 0821    Subjective I was fitted for a brace yesterday. They explained I should progress into it wearing it longer periods each week.  I shouldn't wear it when I'm lying down or exercising.     Currently in Pain? No/denies                         [facility] Adult PT Treatment/Exercise - 08/25/14 0001    Neck Exercises: Machines for Strengthening   UBE (Upper Arm Bike) 6 min, L2   Neck Exercises: Standing   Other Standing Exercises pelvic tilts on wall 2 x 10   Neck Exercises: Supine   Other Supine Exercise pelvic tilts with knees at 90 on red SB   Traction   Type of Traction Cervical   Max (lbs) 8   Hold Time static   Time 15                PT Education - 08/25/14 9390    Education provided Yes   Education Details instructed pt on brace fitting and wearing.  Importance of maintaining posture on her own and doing her exercises, not relying on the brace.  When standing keep knees slightly bent rather than locked and tilt pelvis post with shoulder back to have proper posture.       Person(s) Educated Patient   Methods Explanation;Demonstration;Tactile  cues;Verbal cues   Comprehension Verbalized understanding;Returned demonstration;Verbal cues required;Tactile cues required          PT Short Term Goals - 08/25/14 1030    PT SHORT TERM GOAL #1   Title independent with HEP   Status Achieved   PT SHORT TERM GOAL #2   Title cervical spine ROM of 75% all directions   Status Achieved           PT Long Term Goals - 08/25/14 1030    PT LONG TERM GOAL #1   Title 2/10 pain in neck at worst with no radiculopathy   Status Achieved   PT LONG TERM GOAL #2   Title 75% subjective improvement in symptoms   Status Achieved               Plan - 08/25/14 0825    Clinical Impression Statement Pt will be discontinued from therapy today as her cervical issues have resolved.  She was fitted with a brace yesterday morning and we worked on putting the brace on and off as well as when to use the brace and progressing into the brace.  We spoke about the importance of posture and  continuing her HEP.   Pt will benefit from skilled therapeutic intervention in order to improve on the following deficits Pain;Postural dysfunction;Decreased range of motion;Decreased strength   Rehab Potential Good          G-Codes - 08-29-2014 0913    Functional Assessment Tool Used FOTO   Functional Limitation Carrying, moving and handling objects   Carrying, Moving and Handling Objects Current Status [phone]) At least 20 percent but less than 40 percent impaired, limited or restricted   Carrying, Moving and Handling Objects Goal Status ([id number]) At least 20 percent but less than 40 percent impaired, limited or restricted   Carrying, Moving and Handling Objects Discharge Status [phone]) At least 20 percent but less than 40 percent impaired, limited or restricted      PHYSICAL THERAPY DISCHARGE SUMMARY  Visits from Start of Care: 9  Current functional level related to goals / functional outcomes: Goals met   Remaining deficits: none    Plan: Patient  agrees to discharge.  Patient goals were met. Patient is being discharged due to meeting the stated rehab goals.  ?????       Sumner Boast., PT 2014-08-29, 10:31 AM  [place] [address] [place], [place], [address] Phone: [phone]   Fax:  [phone]

## 2014-08-27 ENCOUNTER — Other Ambulatory Visit (HOSPITAL_BASED_OUTPATIENT_CLINIC_OR_DEPARTMENT_OTHER): Payer: Self-pay | Admitting: Family Medicine

## 2014-08-27 DIAGNOSIS — Z1231 Encounter for screening mammogram for malignant neoplasm of breast: Secondary | ICD-10-CM

## 2014-08-31 ENCOUNTER — Ambulatory Visit (HOSPITAL_BASED_OUTPATIENT_CLINIC_OR_DEPARTMENT_OTHER)
Admission: RE | Admit: 2014-08-31 | Discharge: 2014-08-31 | Disposition: A | Discharge status: Home / Self Care | Payer: Medicare Other | Source: Ambulatory Visit | Attending: Family Medicine | Admitting: Family Medicine

## 2014-08-31 DIAGNOSIS — Z1231 Encounter for screening mammogram for malignant neoplasm of breast: Secondary | ICD-10-CM | POA: Insufficient documentation

## 2015-08-01 ENCOUNTER — Other Ambulatory Visit (HOSPITAL_BASED_OUTPATIENT_CLINIC_OR_DEPARTMENT_OTHER): Payer: Self-pay | Admitting: Family Medicine

## 2015-08-01 DIAGNOSIS — Z1231 Encounter for screening mammogram for malignant neoplasm of breast: Secondary | ICD-10-CM

## 2015-09-01 ENCOUNTER — Ambulatory Visit (HOSPITAL_BASED_OUTPATIENT_CLINIC_OR_DEPARTMENT_OTHER): Payer: Medicare Other

## 2015-09-15 ENCOUNTER — Ambulatory Visit (HOSPITAL_BASED_OUTPATIENT_CLINIC_OR_DEPARTMENT_OTHER): Payer: Medicare Other

## 2015-09-20 ENCOUNTER — Encounter: Payer: Self-pay | Admitting: Physical Therapy

## 2015-09-20 ENCOUNTER — Ambulatory Visit: Payer: Medicare Other | Attending: Specialist | Admitting: Physical Therapy

## 2015-09-20 DIAGNOSIS — M25612 Stiffness of left shoulder, not elsewhere classified: Secondary | ICD-10-CM | POA: Diagnosis present

## 2015-09-20 DIAGNOSIS — M542 Cervicalgia: Secondary | ICD-10-CM | POA: Insufficient documentation

## 2015-09-20 DIAGNOSIS — M25512 Pain in left shoulder: Secondary | ICD-10-CM | POA: Diagnosis present

## 2015-09-20 DIAGNOSIS — M25511 Pain in right shoulder: Secondary | ICD-10-CM | POA: Insufficient documentation

## 2015-09-20 DIAGNOSIS — M25611 Stiffness of right shoulder, not elsewhere classified: Secondary | ICD-10-CM | POA: Diagnosis present

## 2015-09-20 NOTE — Therapy (Signed)
[place], [place], [address] Phone: [phone]   Fax:  [phone]  Physical Therapy Evaluation  Patient Details  Name: Debbie Sanders MRN: [id number] Date of Birth: [birth year] Referring Provider: Jomarie Longs  Encounter Date: 09/20/2015      PT End of Session - 09/20/15 0923    Visit Number 1   Date for PT Re-Evaluation 11/20/15   PT Start Time 0841   PT Stop Time 0940   PT Time Calculation (min) 59 min   Activity Tolerance Patient tolerated treatment well   Behavior During Therapy [facility] for tasks assessed/performed      History reviewed. No pertinent past medical history.  History reviewed. No pertinent surgical history.  There were no vitals filed for this visit.       Subjective Assessment - 09/20/15 0856    Subjective Patient reports she has been having bilateral shoulder pain for about 3 months, she reports that she was doing yardwork, she also reports some stress in her life.  She has been diganosed with tendonitis.  She presents with a postural brace on.     Limitations Lifting;House hold activities   Patient Stated Goals have less pain    Currently in Pain? Yes   Pain Score 8    Pain Location Shoulder   Pain Orientation Left;Right;Posterior   Pain Descriptors / Indicators Aching;Sore   Pain Type Acute pain   Pain Onset More than a month ago   Pain Frequency Constant   Aggravating Factors  reaching, lifting and pulling   Pain Relieving Factors rest and not moving, Tylenol   Effect of Pain on Daily Activities difficulty with ADL's, difficulty dressing            [facility] PT Assessment - 09/20/15 0001      Assessment   Medical Diagnosis shoulder pain   Referring Provider RA Collins   Onset Date/Surgical Date 08/20/15   Hand Dominance Right   Prior Therapy a year ago for for neck and back pain     Precautions   Precautions None     Balance Screen   Has the patient  fallen in the past 6 months No   Has the patient had a decrease in activity level because of a fear of falling?  No   Is the patient reluctant to leave their home because of a fear of falling?  No     Home Environment   Additional Comments loves to do yardwork and gardening     Prior Function   Level of Independence Independent   Leisure gardening, yardwork     Posture/Postural Control   Posture Comments very forward head and rounded shoulders even with her having on the postural brace     ROM / Strength   AROM / PROM / Strength AROM;PROM;Strength     AROM   AROM Assessment Site Shoulder   Right/Left Shoulder Right;Left   Right Shoulder Flexion 85 Degrees   Right Shoulder ABduction 85 Degrees   Right Shoulder Internal Rotation 5 Degrees   Right Shoulder External Rotation 25 Degrees   Left Shoulder Flexion 130 Degrees   Left Shoulder ABduction 130 Degrees   Left Shoulder Internal Rotation 70 Degrees   Left Shoulder External Rotation 60 Degrees     PROM   PROM Assessment Site Shoulder   Right/Left Shoulder Right;Left   Right Shoulder Flexion 110 Degrees   Right Shoulder ABduction 110 Degrees   Right  Shoulder Internal Rotation 10 Degrees   Right Shoulder External Rotation 30 Degrees     Strength   Overall Strength Comments 3+/5 with pain, worse pain in the right     Palpation   Palpation comment she is tight and sore in the upper taps     Special Tests    Special Tests --  negative empty can tests, + impingement tests                   [facility] Adult PT Treatment/Exercise - 09/20/15 0001      Modalities   Modalities Electrical Stimulation;Cryotherapy     Cryotherapy   Number Minutes Cryotherapy 15 Minutes   Cryotherapy Location Shoulder   Type of Cryotherapy Ice pack     Electrical Stimulation   Electrical Stimulation Location shoulder   Electrical Stimulation Action IFC   Electrical Stimulation Parameters sitting   Electrical Stimulation Goals Pain                   PT Short Term Goals - 09/20/15 0926      PT SHORT TERM GOAL #1   Title independent with HEP   Time 2   Period Weeks   Status New           PT Long Term Goals - 09/20/15 [id number]      PT LONG TERM GOAL #1   Title decrease pain 50%   Time 8   Period Weeks   Status New     PT LONG TERM GOAL #2   Title increase AROM of the right shoulder flexion to 130 degrees   Time 8   Period Weeks   Status New     PT LONG TERM GOAL #3   Title increase ER/IR to 50 degrees   Time 8   Period Weeks   Status New     PT LONG TERM GOAL #4   Title report no difficulty with dressing   Time 8   Period Weeks   Status New               Plan - 09/20/15 [id number]    Clinical Impression Statement Patient reports bilateral shoulder pain right worse than the left for the past 3 months, she reports she noticed it after putting out mulch in her flower beds.  She is right handed, she has limited motions bilateral shoulders but the right is very bad in terms of ROM.  She has very poor posture, even though she wears a postural brace   Rehab Potential Good   PT Frequency 2x / week   PT Duration 8 weeks   PT Treatment/Interventions ADLs/Self Care Home Management;Cryotherapy;Electrical Stimulation;Iontophoresis 4mg /ml Dexamethasone;Moist Heat;Ultrasound;Patient/family education;Therapeutic exercise;Therapeutic activities;Manual techniques;Vasopneumatic Device;Taping   PT Next Visit Plan slowly add exercises   Consulted and Agree with Plan of Care Patient      Patient will benefit from skilled therapeutic intervention in order to improve the following deficits and impairments:  Decreased range of motion, Decreased strength, Increased muscle spasms, Impaired flexibility, Postural dysfunction, Improper body mechanics, Pain  Visit Diagnosis: Pain in right shoulder - Plan: PT plan of care cert/re-cert  Stiffness of right shoulder, not elsewhere classified - Plan: PT plan of care  cert/re-cert  Pain in left shoulder - Plan: PT plan of care cert/re-cert  Stiffness of left shoulder, not elsewhere classified - Plan: PT plan of care cert/re-cert      G-Codes - [id number]    Functional Assessment Tool  Used foto 68% limitation   Functional Limitation Other PT primary   Other PT Primary Current Status [id number]) At least 60 percent but less than 80 percent impaired, limited or restricted   Other PT Primary Goal Status [id number]) At least 40 percent but less than 60 percent impaired, limited or restricted       Problem List There are no active problems to display for this patient.   Sumner Boast., PT 09/20/2015, 9:52 AM  [place], [place], [address] Phone: [phone]   Fax:  [phone]  Name: Debbie Sanders MRN: [id number] Date of Birth: 08-Sep-1950

## 2015-09-22 ENCOUNTER — Ambulatory Visit: Payer: Medicare Other | Admitting: Physical Therapy

## 2015-09-22 ENCOUNTER — Encounter: Payer: Self-pay | Admitting: Physical Therapy

## 2015-09-22 DIAGNOSIS — M25511 Pain in right shoulder: Secondary | ICD-10-CM

## 2015-09-22 DIAGNOSIS — M25611 Stiffness of right shoulder, not elsewhere classified: Secondary | ICD-10-CM

## 2015-09-22 DIAGNOSIS — M542 Cervicalgia: Secondary | ICD-10-CM

## 2015-09-22 DIAGNOSIS — M25512 Pain in left shoulder: Secondary | ICD-10-CM

## 2015-09-22 DIAGNOSIS — M25612 Stiffness of left shoulder, not elsewhere classified: Secondary | ICD-10-CM

## 2015-09-22 NOTE — Therapy (Signed)
[place], [place], [address] Phone: [phone]   Fax:  [phone]  Physical Therapy Treatment  Patient Details  Name: Debbie Sanders MRN: [id number] Date of Birth: [birth year] Referring Provider: Jomarie Longs  Encounter Date: 09/22/2015      PT End of Session - 09/22/15 0955    Visit Number 2   Date for PT Re-Evaluation 11/20/15   PT Start Time 0920   PT Stop Time [id number]   PT Time Calculation (min) 55 min   Activity Tolerance Patient tolerated treatment well   Behavior During Therapy [facility] for tasks assessed/performed      History reviewed. No pertinent past medical history.  History reviewed. No pertinent surgical history.  There were no vitals filed for this visit.      Subjective Assessment - 09/22/15 0926    Subjective Patient reports that the treatment felt good, no questions about HEP   Currently in Pain? Yes   Pain Score 2    Pain Location Shoulder   Pain Orientation Left;Right                         [facility] Adult PT Treatment/Exercise - 09/22/15 0001      Exercises   Exercises Shoulder     Shoulder Exercises: Standing   External Rotation 20 reps;Theraband   Theraband Level (Shoulder External Rotation) Level 2 (Red)   Extension 20 reps;Theraband   Theraband Level (Shoulder Extension) Level 2 (Red)   Retraction 20 reps;Theraband   Theraband Level (Shoulder Retraction) Level 2 (Red)     Shoulder Exercises: ROM/Strengthening   UBE (Upper Arm Bike) level 2 x 4 minutes   Cybex Row Limitations 15# 2x10 a lot of cues for posture and to not elevate shoulders   "W" Arms 20   Other ROM/Strengthening Exercises wall slides and circles     Cryotherapy   Number Minutes Cryotherapy 15 Minutes   Cryotherapy Location Shoulder   Type of Cryotherapy Ice pack     Electrical Stimulation   Electrical Stimulation Location shoulder   Electrical Stimulation Action IFC   Electrical Stimulation Parameters sitting   Electrical Stimulation Goals Pain                  PT Short Term Goals - 09/20/15 0926      PT SHORT TERM GOAL #1   Title independent with HEP   Time 2   Period Weeks   Status New           PT Long Term Goals - 09/20/15 [id number]      PT LONG TERM GOAL #1   Title decrease pain 50%   Time 8   Period Weeks   Status New     PT LONG TERM GOAL #2   Title increase AROM of the right shoulder flexion to 130 degrees   Time 8   Period Weeks   Status New     PT LONG TERM GOAL #3   Title increase ER/IR to 50 degrees   Time 8   Period Weeks   Status New     PT LONG TERM GOAL #4   Title report no difficulty with dressing   Time 8   Period Weeks   Status New               Plan - 09/22/15 0955    Clinical Impression Statement Patient with great difficulty with  posture, she wears a postural brace, she tends to round shoulders, have a forward head and elevates the shoulders   PT Next Visit Plan slowly add exercises   Consulted and Agree with Plan of Care Patient      Patient will benefit from skilled therapeutic intervention in order to improve the following deficits and impairments:  Decreased range of motion, Decreased strength, Increased muscle spasms, Impaired flexibility, Postural dysfunction, Improper body mechanics, Pain  Visit Diagnosis: Pain in right shoulder  Stiffness of right shoulder, not elsewhere classified  Pain in left shoulder  Stiffness of left shoulder, not elsewhere classified  Cervicalgia     Problem List There are no active problems to display for this patient.   Sumner Boast., PT 09/22/2015, 10:20 AM  [place] [address] [place], [place], [address] Phone: [phone]   Fax:  [phone]  Name: Debbie Sanders MRN: [id number] Date of Birth: 06-Apr-1950

## 2015-09-27 ENCOUNTER — Encounter: Payer: Self-pay | Admitting: Physical Therapy

## 2015-09-27 ENCOUNTER — Ambulatory Visit: Payer: Medicare Other | Attending: Specialist | Admitting: Physical Therapy

## 2015-09-27 DIAGNOSIS — M25612 Stiffness of left shoulder, not elsewhere classified: Secondary | ICD-10-CM | POA: Insufficient documentation

## 2015-09-27 DIAGNOSIS — M25611 Stiffness of right shoulder, not elsewhere classified: Secondary | ICD-10-CM | POA: Diagnosis present

## 2015-09-27 DIAGNOSIS — M25512 Pain in left shoulder: Secondary | ICD-10-CM | POA: Diagnosis present

## 2015-09-27 DIAGNOSIS — M542 Cervicalgia: Secondary | ICD-10-CM

## 2015-09-27 DIAGNOSIS — M25511 Pain in right shoulder: Secondary | ICD-10-CM | POA: Diagnosis not present

## 2015-09-27 NOTE — Therapy (Signed)
[place] [address] [place], [place], [address] Phone: [phone]   Fax:  [phone]  Physical Therapy Treatment  Patient Details  Name: Debbie Sanders MRN: [id number] Date of Birth: 09-10-1950 Referring Provider: Jomarie Longs  Encounter Date: 09/27/2015      PT End of Session - 09/27/15 1049    Visit Number 3   Date for PT Re-Evaluation 11/20/15   PT Start Time 1008   PT Stop Time 1110   PT Time Calculation (min) 62 min   Activity Tolerance Patient tolerated treatment well   Behavior During Therapy [facility] for tasks assessed/performed      History reviewed. No pertinent past medical history.  History reviewed. No pertinent surgical history.  There were no vitals filed for this visit.      Subjective Assessment - 09/27/15 1018    Subjective Reports some pain in the bilateral upper arms   Currently in Pain? Yes   Pain Score 3    Pain Location Shoulder   Pain Orientation Right;Left   Aggravating Factors  lifting   Pain Relieving Factors treatment                         [facility] Adult PT Treatment/Exercise - 09/27/15 0001      Shoulder Exercises: Standing   External Rotation 20 reps;Theraband   Theraband Level (Shoulder External Rotation) Level 2 (Red)   Extension 20 reps;Theraband   Theraband Level (Shoulder Extension) Level 2 (Red)   Row 20 reps   Row Weight (lbs) 10   Retraction 20 reps;Theraband   Theraband Level (Shoulder Retraction) Level 2 (Red)   Other Standing Exercises D2 red tband 20 reps   Other Standing Exercises weighted ball overhead all with back against wall and cues for posture     Shoulder Exercises: ROM/Strengthening   UBE (Upper Arm Bike) level 2 x 4 minutes   "W" Arms 20   Other ROM/Strengthening Exercises wall slides and circles     Shoulder Exercises: Stretch   Corner Stretch 4 reps;10 seconds     Shoulder Exercises: Body Blade   Flexion 30 seconds;3 reps      Cryotherapy   Number Minutes Cryotherapy 15 Minutes   Cryotherapy Location Shoulder   Type of Cryotherapy Ice pack     Electrical Stimulation   Electrical Stimulation Location shoulder   Electrical Stimulation Action IFC   Electrical Stimulation Parameters sitting   Electrical Stimulation Goals Pain                  PT Short Term Goals - 09/20/15 0926      PT SHORT TERM GOAL #1   Title independent with HEP   Time 2   Period Weeks   Status New           PT Long Term Goals - 09/20/15 [id number]      PT LONG TERM GOAL #1   Title decrease pain 50%   Time 8   Period Weeks   Status New     PT LONG TERM GOAL #2   Title increase AROM of the right shoulder flexion to 130 degrees   Time 8   Period Weeks   Status New     PT LONG TERM GOAL #3   Title increase ER/IR to 50 degrees   Time 8   Period Weeks   Status New     PT LONG TERM GOAL #4  Title report no difficulty with dressing   Time 8   Period Weeks   Status New               Plan - 09/27/15 1050    Clinical Impression Statement Still having difficulty with posture, needing verbal and tactile cues to help correct.  She does fatigue with exercises but did not c/o much of an increase in pain   PT Next Visit Plan slowly add exercises   Consulted and Agree with Plan of Care Patient      Patient will benefit from skilled therapeutic intervention in order to improve the following deficits and impairments:  Decreased range of motion, Decreased strength, Increased muscle spasms, Impaired flexibility, Postural dysfunction, Improper body mechanics, Pain  Visit Diagnosis: Pain in right shoulder  Stiffness of right shoulder, not elsewhere classified  Pain in left shoulder  Stiffness of left shoulder, not elsewhere classified  Cervicalgia     Problem List There are no active problems to display for this patient.   Sumner Boast., PT 09/27/2015, 10:57 AM  [place] [address] [place], [place], [address] Phone: [phone]   Fax:  [phone]  Name: Zitlalli Remsberg MRN: [id number] Date of Birth: [birth year]

## 2015-10-04 ENCOUNTER — Ambulatory Visit: Payer: Medicare Other | Admitting: Physical Therapy

## 2015-10-06 ENCOUNTER — Ambulatory Visit (HOSPITAL_BASED_OUTPATIENT_CLINIC_OR_DEPARTMENT_OTHER)
Admission: RE | Admit: 2015-10-06 | Discharge: 2015-10-06 | Disposition: A | Discharge status: Home / Self Care | Payer: Medicare Other | Source: Ambulatory Visit | Attending: Family Medicine | Admitting: Family Medicine

## 2015-10-06 DIAGNOSIS — Z1231 Encounter for screening mammogram for malignant neoplasm of breast: Secondary | ICD-10-CM | POA: Insufficient documentation

## 2015-10-10 ENCOUNTER — Ambulatory Visit: Payer: Medicare Other | Admitting: Physical Therapy

## 2015-10-10 ENCOUNTER — Encounter: Payer: Self-pay | Admitting: Physical Therapy

## 2015-10-10 DIAGNOSIS — M25512 Pain in left shoulder: Secondary | ICD-10-CM

## 2015-10-10 DIAGNOSIS — M25511 Pain in right shoulder: Secondary | ICD-10-CM | POA: Diagnosis not present

## 2015-10-10 DIAGNOSIS — M542 Cervicalgia: Secondary | ICD-10-CM

## 2015-10-10 DIAGNOSIS — M25611 Stiffness of right shoulder, not elsewhere classified: Secondary | ICD-10-CM

## 2015-10-10 DIAGNOSIS — M25612 Stiffness of left shoulder, not elsewhere classified: Secondary | ICD-10-CM

## 2015-10-10 NOTE — Therapy (Signed)
[place], [place], [address] Phone: [phone]   Fax:  [phone]  Physical Therapy Treatment  Patient Details  Name: Debbie Sanders MRN: [id number] Date of Birth: May 16, 1950 Referring Provider: Jomarie Longs  Encounter Date: 10/10/2015      PT End of Session - 10/10/15 1136    Visit Number 4   Date for PT Re-Evaluation 11/20/15   PT Start Time 1100   PT Stop Time 1157   PT Time Calculation (min) 57 min   Activity Tolerance Patient tolerated treatment well   Behavior During Therapy [facility] for tasks assessed/performed      History reviewed. No pertinent past medical history.  History reviewed. No pertinent surgical history.  There were no vitals filed for this visit.      Subjective Assessment - 10/10/15 1117    Subjective Reports that she has been painting, and having some upper arm pain   Currently in Pain? Yes   Pain Score 4    Pain Location Shoulder   Pain Orientation Right;Left   Aggravating Factors  painting                         [facility] Adult PT Treatment/Exercise - 10/10/15 0001      Shoulder Exercises: Standing   External Rotation 20 reps;Theraband   Theraband Level (Shoulder External Rotation) Level 2 (Red)   Extension 20 reps;Theraband   Theraband Level (Shoulder Extension) Level 2 (Red)   Row 20 reps   Row Weight (lbs) 10   Retraction 20 reps;Theraband   Theraband Level (Shoulder Retraction) Level 2 (Red)   Other Standing Exercises D2 red tband 20 reps   Other Standing Exercises weighted ball overhead all with back against wall and cues for posture     Shoulder Exercises: ROM/Strengthening   UBE (Upper Arm Bike) level 4x 4 minutes   Cybex Row Limitations 15# 2x10 a lot of cues for posture and to not elevate shoulders   "W" Arms 20   Other ROM/Strengthening Exercises wall slides and circles     Shoulder Exercises: Stretch   Corner Stretch 4 reps;10  seconds     Cryotherapy   Number Minutes Cryotherapy 15 Minutes   Cryotherapy Location Shoulder   Type of Cryotherapy Ice pack     Electrical Stimulation   Electrical Stimulation Location bilateral upper armsshoulder   Electrical Stimulation Action premod   Electrical Stimulation Parameters supine   Electrical Stimulation Goals Pain                  PT Short Term Goals - 09/20/15 0926      PT SHORT TERM GOAL #1   Title independent with HEP   Time 2   Period Weeks   Status New           PT Long Term Goals - 09/20/15 [id number]      PT LONG TERM GOAL #1   Title decrease pain 50%   Time 8   Period Weeks   Status New     PT LONG TERM GOAL #2   Title increase AROM of the right shoulder flexion to 130 degrees   Time 8   Period Weeks   Status New     PT LONG TERM GOAL #3   Title increase ER/IR to 50 degrees   Time 8   Period Weeks   Status New     PT LONG  TERM GOAL #4   Title report no difficulty with dressing   Time 8   Period Weeks   Status New               Plan - 10/10/15 1138    Clinical Impression Statement Has great difficulty with posture, she does wear a brace for this but it does not seem to help much.  Needs many cues for posture.  Exercises fatigue her but do not seem to cause any extra pain   PT Next Visit Plan slowly add exercises   Consulted and Agree with Plan of Care Patient      Patient will benefit from skilled therapeutic intervention in order to improve the following deficits and impairments:  Decreased range of motion, Decreased strength, Increased muscle spasms, Impaired flexibility, Postural dysfunction, Improper body mechanics, Pain  Visit Diagnosis: Pain in right shoulder  Stiffness of right shoulder, not elsewhere classified  Pain in left shoulder  Stiffness of left shoulder, not elsewhere classified  Cervicalgia     Problem List There are no active problems to display for this  patient.   Sumner Boast., PT 10/10/2015, 11:40 AM  [place], [place], [address] Phone: [phone]   Fax:  [phone]  Name: Debbie Sanders MRN: [id number] Date of Birth: 06-20-1950

## 2015-10-17 ENCOUNTER — Encounter: Payer: Medicare Other | Admitting: Physical Therapy

## 2015-10-19 ENCOUNTER — Ambulatory Visit: Payer: Medicare Other | Admitting: Physical Therapy

## 2015-10-19 ENCOUNTER — Encounter: Payer: Self-pay | Admitting: Physical Therapy

## 2015-10-19 DIAGNOSIS — M25612 Stiffness of left shoulder, not elsewhere classified: Secondary | ICD-10-CM

## 2015-10-19 DIAGNOSIS — M25611 Stiffness of right shoulder, not elsewhere classified: Secondary | ICD-10-CM

## 2015-10-19 DIAGNOSIS — M25512 Pain in left shoulder: Secondary | ICD-10-CM

## 2015-10-19 DIAGNOSIS — M25511 Pain in right shoulder: Secondary | ICD-10-CM

## 2015-10-19 NOTE — Therapy (Signed)
[place], [place], [address] Phone: [phone]   Fax:  [phone]  Physical Therapy Treatment  Patient Details  Name: Debbie Sanders MRN: [id number] Date of Birth: [birth year] Referring Provider: Jomarie Longs  Encounter Date: 10/19/2015      PT End of Session - 10/19/15 1206    Visit Number 5   Date for PT Re-Evaluation 11/20/15   PT Start Time 1100   PT Stop Time 1150   PT Time Calculation (min) 50 min   Activity Tolerance Patient tolerated treatment well   Behavior During Therapy [facility] for tasks assessed/performed      History reviewed. No pertinent past medical history.  History reviewed. No pertinent surgical history.  There were no vitals filed for this visit.      Subjective Assessment - 10/19/15 1159    Subjective Patient got a TENS unit on line and brings it in today, she needs instruction in its use   Currently in Pain? Yes   Pain Score 3    Pain Location Shoulder   Pain Orientation Right;Left   Aggravating Factors  painting bathroom   Pain Relieving Factors treatment                         [facility] Adult PT Treatment/Exercise - 10/19/15 0001      Self-Care   Self-Care Other Self-Care Comments   Other Self-Care Comments  Patient brought in TENS, we applied it and went over how to use it, went over safety, precautions, freq/duration, batteries, electrodes, parameters, etc..Marland KitchenShe needed a lot of instruction in this as she had a lot of questions and wanted to be sure she understood and practice the actual use of the machine.  She was independent and safe prior to leaving today                  PT Short Term Goals - 09/20/15 0926      PT SHORT TERM GOAL #1   Title independent with HEP   Time 2   Period Weeks   Status New           PT Long Term Goals - 10/19/15 1207      PT LONG TERM GOAL #1   Title decrease pain 50%   Status Partially Met      PT LONG TERM GOAL #2   Title increase AROM of the right shoulder flexion to 130 degrees   Status Achieved     PT LONG TERM GOAL #3   Title increase ER/IR to 50 degrees   Status Partially Met     PT LONG TERM GOAL #4   Title report no difficulty with dressing   Status Achieved               Plan - 10/19/15 1206    Clinical Impression Statement Patient was independent and understoop application and use of the TENS prior to leaving today, she did have a lot of questions   PT Next Visit Plan will place on hold   Consulted and Agree with Plan of Care Patient      Patient will benefit from skilled therapeutic intervention in order to improve the following deficits and impairments:  Decreased range of motion, Decreased strength, Increased muscle spasms, Impaired flexibility, Postural dysfunction, Improper body mechanics, Pain  Visit Diagnosis: Pain in right shoulder  Stiffness of right shoulder, not elsewhere classified  Pain in  left shoulder  Stiffness of left shoulder, not elsewhere classified     Problem List There are no active problems to display for this patient.   Sumner Boast., PT 10/19/2015, 12:10 PM  [place], [place], [address] Phone: [phone]   Fax:  [phone]  Name: Debbie Sanders MRN: [id number] Date of Birth: 01/15/1951

## 2015-11-18 ENCOUNTER — Ambulatory Visit: Payer: Medicare Other | Attending: Family Medicine

## 2015-11-18 DIAGNOSIS — R2689 Other abnormalities of gait and mobility: Secondary | ICD-10-CM | POA: Insufficient documentation

## 2015-11-18 DIAGNOSIS — H8113 Benign paroxysmal vertigo, bilateral: Secondary | ICD-10-CM | POA: Diagnosis present

## 2015-11-18 DIAGNOSIS — R42 Dizziness and giddiness: Secondary | ICD-10-CM
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
## 2015-11-29 ENCOUNTER — Ambulatory Visit: Payer: Medicare Other | Attending: Family Medicine

## 2015-11-29 DIAGNOSIS — H8113 Benign paroxysmal vertigo, bilateral: Secondary | ICD-10-CM | POA: Insufficient documentation

## 2015-11-29 DIAGNOSIS — R2689 Other abnormalities of gait and mobility: Secondary | ICD-10-CM | POA: Insufficient documentation

## 2015-11-29 DIAGNOSIS — R42 Dizziness and giddiness: Secondary | ICD-10-CM | POA: Insufficient documentation
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
## 2015-12-01 ENCOUNTER — Ambulatory Visit: Payer: Medicare Other | Admitting: Physical Therapy

## 2015-12-01 DIAGNOSIS — R42 Dizziness and giddiness: Secondary | ICD-10-CM

## 2015-12-01 DIAGNOSIS — R2689 Other abnormalities of gait and mobility: Secondary | ICD-10-CM
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
## 2015-12-01 NOTE — Patient Instructions (Addendum)
SINGLE LIMB STANCE    Stance: single leg on floor. Raise leg. Hold _10__ seconds. Repeat with other leg. _2__ reps per set, _2-3__ sets per day, _5__ days per week  DO STANDING NEAR COUNTER (KITCHEN SINK)   Copyright  VHI. All rights reserved.  Neck Rotation    Begin with chin level, head centered over spine. Slowly turn head to look over shoulder, keeping head centered, shoulders and torso stationary. Hold _30__ seconds. Slowly return to starting position. Repeat to other side. Repeat _3___ times to each side.   Copyright  VHI. All rights reserved.  AROM: Lateral Neck Flexion    Slowly tilt head toward one shoulder, then the other. Hold each position _30___ seconds. Repeat __2__ times per set. Do _2___ sets per session. Do __2-3__ sessions per day.  USE OPPOSITE HAND TO PULL HEAD TOWARD SHOULDER http://orth.exer.us/297   Copyright  VHI. All rights reserved.

## 2015-12-06 ENCOUNTER — Ambulatory Visit: Payer: Medicare Other | Admitting: Physical Therapy

## 2015-12-06 DIAGNOSIS — R2689 Other abnormalities of gait and mobility: Secondary | ICD-10-CM

## 2015-12-06 DIAGNOSIS — R42 Dizziness and giddiness: Secondary | ICD-10-CM | POA: Diagnosis not present
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
## 2015-12-08 ENCOUNTER — Ambulatory Visit: Payer: Medicare Other

## 2015-12-08 DIAGNOSIS — R42 Dizziness and giddiness: Secondary | ICD-10-CM | POA: Diagnosis not present

## 2015-12-08 DIAGNOSIS — R2689 Other abnormalities of gait and mobility: Secondary | ICD-10-CM

## 2015-12-08 NOTE — Patient Instructions (Addendum)
Perform at kitchen counter with one hand on counter for support and safety:  Side to Side Head Motion    Perform at kitchen counter with hand on counter. Walking on solid surface, turn head and eyes to left for __2__ steps. Then, turn head and eyes to opposite side for __2__ steps. Repeat sequence __4__ times per session. Do __1__ sessions per day.  Copyright  VHI. All rights reserved.  Up / Down Head Motion    Perform without assistive device. Walking on solid surface, move head and eyes toward ceiling for __2__ steps.  Then, move head and eyes toward floor for _2___ steps. Repeat __4__ times per session. Do __1__ sessions per day.  Copyright  VHI. All rights reserved.   Backward    Walk backwards with hand on counter for support and safety, with eyes open. Take 7 even steps (or length of counter), making sure each foot lifts off floor. Repeat for __4__ times per session. Do ___1_ sessions per day.  Copyright  VHI. All rights reserved.
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
## 2015-12-12 ENCOUNTER — Ambulatory Visit: Payer: Medicare Other | Admitting: Physical Therapy

## 2015-12-12 DIAGNOSIS — R2689 Other abnormalities of gait and mobility: Secondary | ICD-10-CM

## 2015-12-12 DIAGNOSIS — R42 Dizziness and giddiness: Secondary | ICD-10-CM | POA: Diagnosis not present

## 2015-12-12 NOTE — Therapy (Signed)
[facility] [address] [place], [place], [address] Phone: [phone]   Fax:  [phone]  Physical Therapy Treatment  Patient Details  Name: Debbie Sanders MRN: [id number] Date of Birth: 25-Jun-1950 Referring Provider: Dr. Rex Kras  Encounter Date: 12/12/2015      PT End of Session - 12/12/15 1950    Visit Number 6   Number of Visits 9   Date for PT Re-Evaluation 12/18/15   Authorization Type UHC Medicare-no Josem Kaufmann per Lattie Haw. G-CODE AND PROGRESS NOTE EVERY 10TH VISIT.    PT Start Time 0932   PT Stop Time 1016   PT Time Calculation (min) 44 min      Past Medical History:  Diagnosis Date  . Anxiety   . Cancer ([place]) 1993   B breast CA, lumpectomy per pt report  . Chronic back pain 1993   per pt  . Hypertension     No past surgical history on file.  There were no vitals filed for this visit.      Subjective Assessment - 12/12/15 1945    Currently in Pain? No/denies   Multiple Pain Sites No                   Neuro Re-ed; pt performed tap ups to 6" step (2nd step) x 5 reps each:  Then tap ups to 1st, 2nd step with added crossover step for  Incr. SLS time with CGA -- 5 reps each leg Marching on incline with head turns - up/down and side to side x 5 reps; standing with EC - 5 head turns side to side and up/down  Standing on red mat - performing crossovers with CGA - 5 reps each leg front and then back  Pt performed standing on grey foam in corner - trunk rotation with touching R and L walls - 5 reps each Squats x 5 reps standing on foam with EC with min to mod assist  Step negotiation with minimal UE support - step by step sequence with SBA - for balance training           Balance Exercises - 12/12/15 1946      Balance Exercises: Standing   Standing Eyes Opened Narrow base of support (BOS);Wide (BOA);Head turns;Solid surface;5 reps   Standing Eyes Closed Wide (BOA);Solid surface;1 rep;10  secs   SLS Eyes open;5 reps;Foam/compliant surface  hip flexion with knee extension then hip extension  each leg   Rockerboard Anterior/posterior;EO;10 reps   Other Standing Exercises cone taps (5) with each foot x 2 reps; then tipping cones over and standing up with min assist             PT Short Term Goals - 11/18/15 1232      PT SHORT TERM GOAL #1   Title same as LTGs.           PT Long Term Goals - 12/12/15 1951      PT LONG TERM GOAL #1   Title Pt will be IND in HEP to improve dizziness and balance. TARGET DATE FOR ALL LTGS: 12/16/15   Baseline met 12-12-15     PT LONG TERM GOAL #2   Title Perform positional testing and write goal prn.   Status Deferred     PT LONG TERM GOAL #3   Title Perform FGA and write goal prn.    Baseline Score 18/30 on 12-01-15   Status On-going     PT LONG TERM GOAL #4  Title Pt will amb. 500' over even/uneven terrain, while performing head turns without dizziness incr. >/=2 points in order to improve functional mobility.    Status On-going     PT LONG TERM GOAL #5   Title Pt will improve FGA score to 26/30 to decr. falls risk.    Status New             Patient will benefit from skilled therapeutic intervention in order to improve the following deficits and impairments:     Visit Diagnosis: Other abnormalities of gait and mobility     Problem List There are no active problems to display for this patient.   Alda Lea, PT 12/12/2015, 7:54 PM  [facility] [address] [place], [place], [address] Phone: [phone]   Fax:  [phone]  Name: Debbie Sanders MRN: [id number] Date of Birth: 1950-06-23

## 2015-12-13 ENCOUNTER — Ambulatory Visit: Payer: Medicare Other

## 2015-12-13 DIAGNOSIS — R2689 Other abnormalities of gait and mobility: Secondary | ICD-10-CM

## 2015-12-13 DIAGNOSIS — R42 Dizziness and giddiness: Secondary | ICD-10-CM
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
## 2016-07-12 IMAGING — MG MM DIGITAL SCREENING
5 series · 5 of 5 positions shown · non-contrast
Comparison: Previous exam(s).

CLINICAL DATA: Screening.

EXAM:
DIGITAL SCREENING BILATERAL MAMMOGRAM WITH CAD

[R CC (1 of 2)]
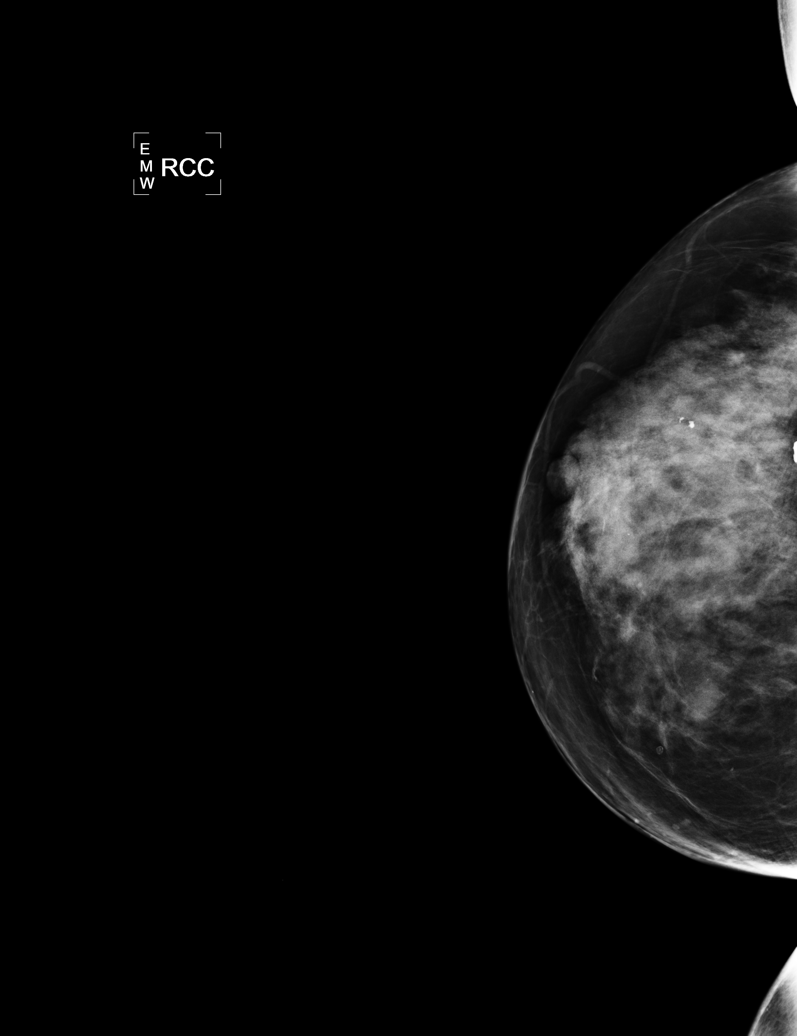

[L CC]
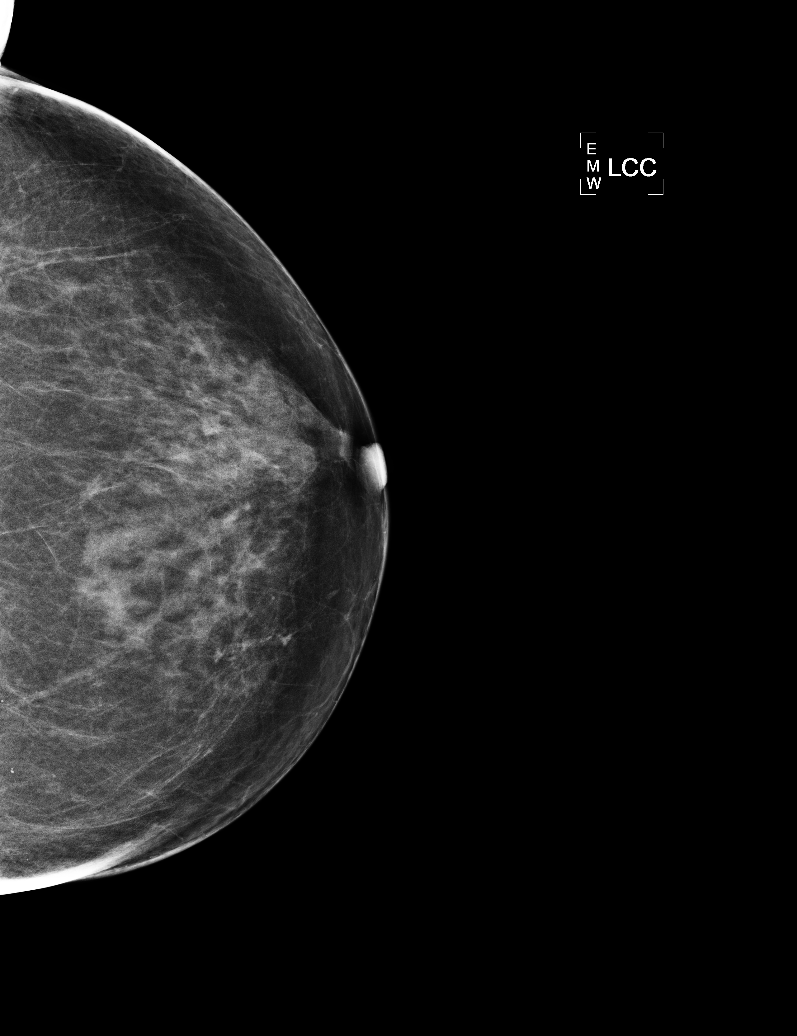

[L MLO]
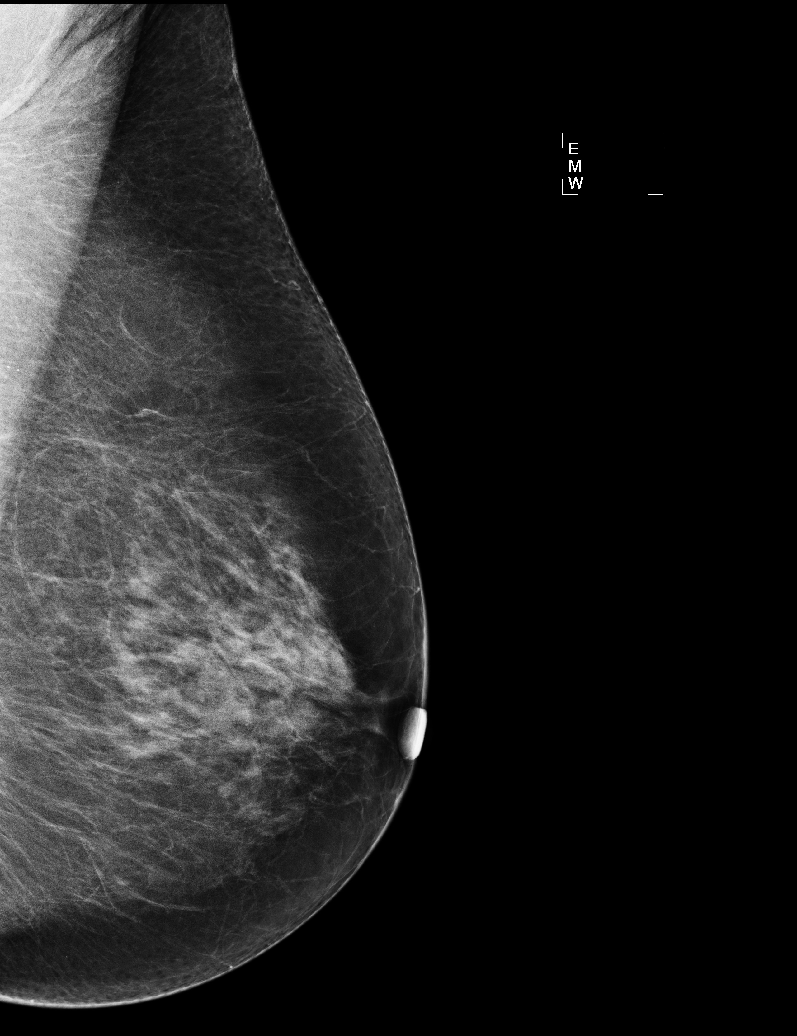

[R MLO]
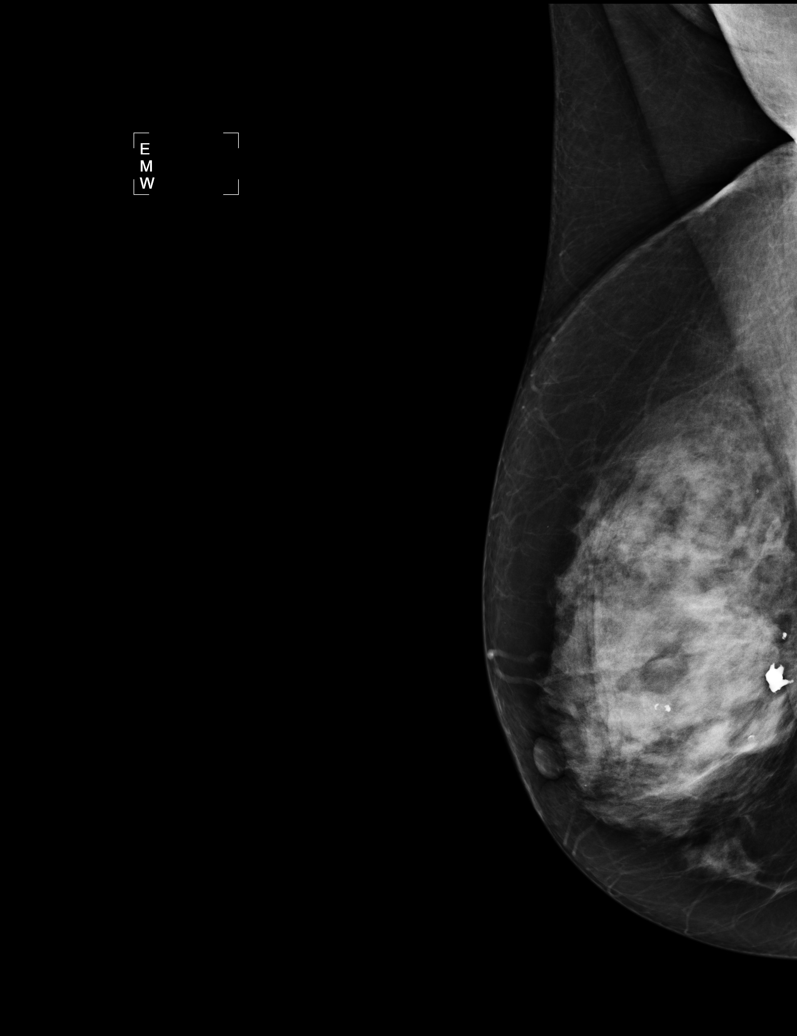

[R CC (2 of 2)]
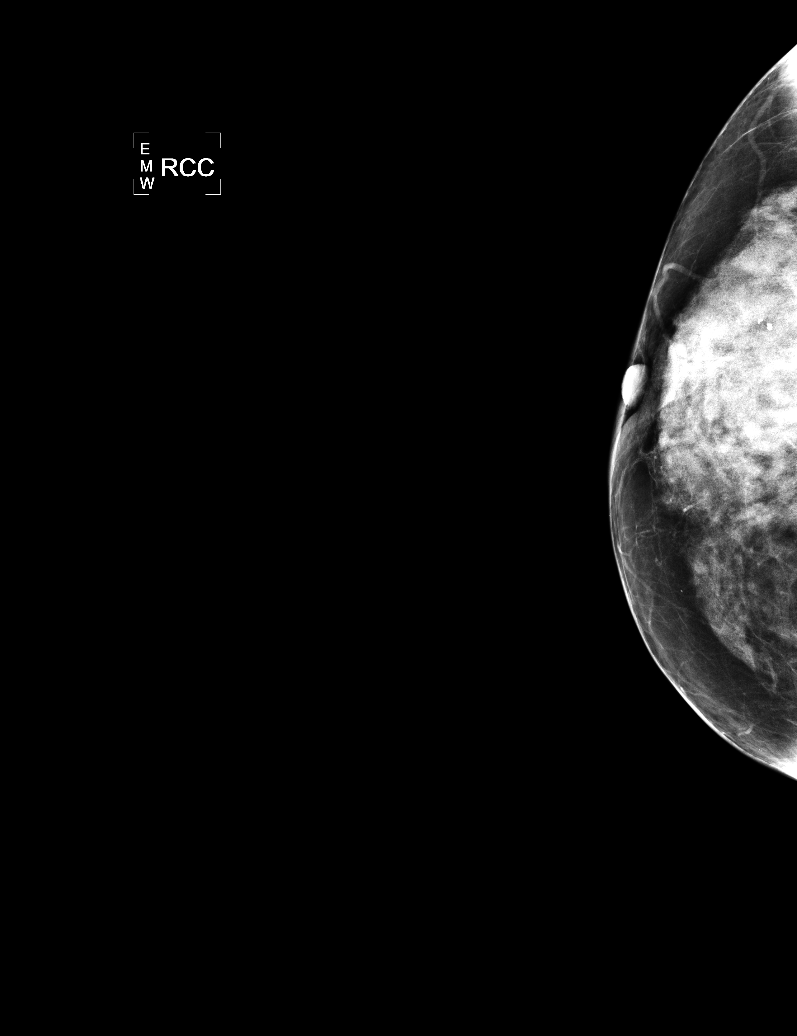

[5 of 5 positions shown; findings below may reference images not displayed]

ACR Breast Density Category c: The breast tissue is heterogeneously
dense, which may obscure small masses.
FINDINGS: There are no findings suspicious for malignancy. Images were
processed with CAD.
IMPRESSION: No mammographic evidence of malignancy. A result letter of this
screening mammogram will be mailed directly to the patient.

RECOMMENDATION:
Screening mammogram in one year. (Code:[id number])

BI-RADS CATEGORY  1: Negative.

## 2016-08-13 ENCOUNTER — Other Ambulatory Visit (HOSPITAL_BASED_OUTPATIENT_CLINIC_OR_DEPARTMENT_OTHER): Payer: Self-pay | Admitting: Family Medicine

## 2016-08-13 DIAGNOSIS — Z1231 Encounter for screening mammogram for malignant neoplasm of breast: Secondary | ICD-10-CM

## 2016-10-08 ENCOUNTER — Ambulatory Visit (HOSPITAL_BASED_OUTPATIENT_CLINIC_OR_DEPARTMENT_OTHER): Payer: Medicare Other

## 2016-10-11 ENCOUNTER — Ambulatory Visit (HOSPITAL_BASED_OUTPATIENT_CLINIC_OR_DEPARTMENT_OTHER)
Admission: RE | Admit: 2016-10-11 | Discharge: 2016-10-11 | Disposition: A | Discharge status: Home / Self Care | Payer: Medicare Other | Source: Ambulatory Visit | Attending: Family Medicine | Admitting: Family Medicine

## 2016-10-11 DIAGNOSIS — Z1231 Encounter for screening mammogram for malignant neoplasm of breast: Secondary | ICD-10-CM | POA: Diagnosis present

## 2017-03-19 ENCOUNTER — Ambulatory Visit (INDEPENDENT_AMBULATORY_CARE_PROVIDER_SITE_OTHER): Payer: Self-pay | Admitting: Orthopedic Surgery

## 2017-09-17 ENCOUNTER — Other Ambulatory Visit (HOSPITAL_BASED_OUTPATIENT_CLINIC_OR_DEPARTMENT_OTHER): Payer: Self-pay | Admitting: Family Medicine

## 2017-09-17 DIAGNOSIS — Z1231 Encounter for screening mammogram for malignant neoplasm of breast: Secondary | ICD-10-CM

## 2017-10-14 ENCOUNTER — Ambulatory Visit (HOSPITAL_BASED_OUTPATIENT_CLINIC_OR_DEPARTMENT_OTHER)
Admission: RE | Admit: 2017-10-14 | Discharge: 2017-10-14 | Disposition: A | Discharge status: Home / Self Care | Payer: Medicare Other | Source: Ambulatory Visit | Attending: Family Medicine | Admitting: Family Medicine

## 2017-10-14 ENCOUNTER — Encounter (HOSPITAL_BASED_OUTPATIENT_CLINIC_OR_DEPARTMENT_OTHER): Payer: Self-pay

## 2017-10-14 DIAGNOSIS — Z1231 Encounter for screening mammogram for malignant neoplasm of breast: Secondary | ICD-10-CM | POA: Diagnosis present

## 2018-02-11 ENCOUNTER — Other Ambulatory Visit (HOSPITAL_BASED_OUTPATIENT_CLINIC_OR_DEPARTMENT_OTHER): Payer: Self-pay | Admitting: Family Medicine

## 2018-02-11 DIAGNOSIS — R0981 Nasal congestion: Secondary | ICD-10-CM

## 2018-02-12 ENCOUNTER — Ambulatory Visit (HOSPITAL_BASED_OUTPATIENT_CLINIC_OR_DEPARTMENT_OTHER)
Admission: RE | Admit: 2018-02-12 | Discharge: 2018-02-12 | Disposition: A | Discharge status: Home / Self Care | Payer: Medicare Other | Source: Ambulatory Visit | Attending: Family Medicine | Admitting: Family Medicine

## 2018-02-12 DIAGNOSIS — R0981 Nasal congestion: Secondary | ICD-10-CM | POA: Diagnosis present

## 2018-09-19 ENCOUNTER — Other Ambulatory Visit: Payer: Self-pay | Admitting: Family Medicine

## 2018-09-19 DIAGNOSIS — M8580 Other specified disorders of bone density and structure, unspecified site: Secondary | ICD-10-CM

## 2018-09-26 ENCOUNTER — Other Ambulatory Visit (HOSPITAL_BASED_OUTPATIENT_CLINIC_OR_DEPARTMENT_OTHER): Payer: Self-pay | Admitting: Family Medicine

## 2018-09-26 DIAGNOSIS — Z1239 Encounter for other screening for malignant neoplasm of breast: Secondary | ICD-10-CM

## 2018-10-16 ENCOUNTER — Other Ambulatory Visit: Payer: Self-pay

## 2018-10-16 ENCOUNTER — Ambulatory Visit (HOSPITAL_BASED_OUTPATIENT_CLINIC_OR_DEPARTMENT_OTHER)
Admission: RE | Admit: 2018-10-16 | Discharge: 2018-10-16 | Disposition: A | Discharge status: Home / Self Care | Payer: Medicare Other | Source: Ambulatory Visit | Attending: Family Medicine | Admitting: Family Medicine

## 2018-10-16 DIAGNOSIS — Z1231 Encounter for screening mammogram for malignant neoplasm of breast: Secondary | ICD-10-CM | POA: Insufficient documentation

## 2018-10-16 DIAGNOSIS — Z1239 Encounter for other screening for malignant neoplasm of breast: Secondary | ICD-10-CM

## 2018-12-05 ENCOUNTER — Other Ambulatory Visit: Payer: Self-pay

## 2018-12-05 ENCOUNTER — Ambulatory Visit
Admission: RE | Admit: 2018-12-05 | Discharge: 2018-12-05 | Disposition: A | Discharge status: Home / Self Care | Payer: Medicare Other | Source: Ambulatory Visit | Attending: Family Medicine | Admitting: Family Medicine

## 2018-12-05 DIAGNOSIS — M8580 Other specified disorders of bone density and structure, unspecified site: Secondary | ICD-10-CM

## 2019-03-20 ENCOUNTER — Ambulatory Visit: Payer: Medicare Other | Attending: Internal Medicine

## 2019-03-20 DIAGNOSIS — Z23 Encounter for immunization: Secondary | ICD-10-CM

## 2019-03-20 NOTE — Progress Notes (Signed)
   Covid-19 Vaccination Clinic  Name:  Debbie Sanders    MRN: [id number] DOB: 1950-03-18  03/20/2019  [name] was observed post Covid-19 immunization for 15 minutes without incidence. She was provided with Vaccine Information Sheet and instruction to access the V-Safe system.   [name] was instructed to call 911 with any severe reactions post vaccine: Marland Kitchen Difficulty breathing  . Swelling of your face and throat  . A fast heartbeat  . A bad rash all over your body  . Dizziness and weakness    Immunizations Administered    Name Date Dose VIS Date Route   Pfizer COVID-19 Vaccine 03/20/2019 10:16 AM 0.3 mL 01/02/2019 Intramuscular   Manufacturer: [place]   Lot: [id number]   [place]: [id number]

## 2019-04-14 ENCOUNTER — Ambulatory Visit: Payer: Medicare Other | Attending: Internal Medicine

## 2019-04-14 DIAGNOSIS — Z23 Encounter for immunization: Secondary | ICD-10-CM

## 2019-04-14 NOTE — Progress Notes (Signed)
   Covid-19 Vaccination Clinic  Name:  Debbie Sanders    MRN: [id number] DOB: 02-27-1950  04/14/2019  [name] was observed post Covid-19 immunization for 15 minutes without incident. She was provided with Vaccine Information Sheet and instruction to access the V-Safe system.   [name] was instructed to call 911 with any severe reactions post vaccine: Marland Kitchen Difficulty breathing  . Swelling of face and throat  . A fast heartbeat  . A bad rash all over body  . Dizziness and weakness   Immunizations Administered    Name Date Dose VIS Date Route   Pfizer COVID-19 Vaccine 04/14/2019 12:45 PM 0.3 mL 01/02/2019 Intramuscular   Manufacturer: [place]   Lot: [id number]   [place]: [id number]

## 2019-05-01 ENCOUNTER — Ambulatory Visit: Payer: Medicare Other | Attending: Family Medicine | Admitting: Physical Therapy

## 2019-05-01 ENCOUNTER — Other Ambulatory Visit: Payer: Self-pay

## 2019-05-01 ENCOUNTER — Encounter: Payer: Self-pay | Admitting: Physical Therapy

## 2019-05-01 DIAGNOSIS — M62830 Muscle spasm of back: Secondary | ICD-10-CM | POA: Diagnosis present

## 2019-05-01 DIAGNOSIS — M545 Low back pain, unspecified: Secondary | ICD-10-CM

## 2019-05-01 NOTE — Therapy (Signed)
[facility] [place], [place], [address] Phone: [phone]   Fax:  [phone]  Physical Therapy Evaluation  Patient Details  Name: Debbie Sanders MRN: [id number] Date of Birth: 27-Sep-1950 Referring Provider (PT): Belva Bertin   Encounter Date: 05/01/2019  PT End of Session - 05/01/19 1041    Visit Number  1    Date for PT Re-Evaluation  07/01/19    PT Start Time  1010    PT Stop Time  1055    PT Time Calculation (min)  45 min    Activity Tolerance  Patient tolerated treatment well    Behavior During Therapy  [facility] for tasks assessed/performed       Past Medical History:  Diagnosis Date  . Anxiety   . Cancer ([place]) 1993   B breast CA, lumpectomy per pt report  . Chronic back pain 1993   per pt  . Hypertension     Past Surgical History:  Procedure Laterality Date  . BREAST BIOPSY    . BREAST EXCISIONAL BIOPSY    . BREAST LUMPECTOMY      There were no vitals filed for this visit.  Subjective Assessment - 05/01/19 1015    Subjective  Patient reports that she was lifting on a shelf about a month ago and she felt pain immediately, then seemed to get worse,    Limitations  Lifting;Standing;House hold activities    Patient Stated Goals  have less pain    Currently in Pain?  Yes    Pain Score  8     Pain Location  Back    Pain Orientation  Lower    Pain Descriptors / Indicators  Aching    Pain Type  Acute pain    Pain Radiating Towards  denies    Pain Onset  1 to 4 weeks ago    Pain Frequency  Constant    Aggravating Factors   walking, bending, lifting pain up to 9/10    Pain Relieving Factors  rest, heat Tylenol at best a 5/10    Effect of Pain on Daily Activities  limits everything due to pain, has to stop and rest         [facility] PT Assessment - 05/01/19 0001      Assessment   Medical Diagnosis  LBP    Referring Provider (PT)  Belva Bertin    Onset Date/Surgical Date  03/31/19    Prior Therapy  no      Precautions   Precautions  None      Balance Screen   Has the patient fallen in the past 6 months  No    Has the patient had a decrease in activity level because of a fear of falling?   No    Is the patient reluctant to leave their home because of a fear of falling?   No      Home Environment   Additional Comments  does yardwork, gardening, housework      Prior Function   Level of Independence  Independent    Vocation  Retired    Leisure  no exercise      Mining engineer Comments  very poor posture, rounded shoulders, very slouched, significant kyphosis      ROM / Strength   AROM / PROM / Strength  AROM;Strength      AROM   Overall AROM Comments  lumbar ROM decreased 75% with some c/o pain  in the low back and into the hips      Strength   Overall Strength Comments  hips 4-/5 with some increase of pain      Flexibility   Soft Tissue Assessment /Muscle Length  yes    Hamstrings  very tight 40 degree and then pain and stopped the motions    Piriformis  very flexible      Palpation   Palpation comment  tender in the L/S area and into the iliac areas                   [facility] Adult PT Treatment/Exercise - 05/01/19 0001      Modalities   Modalities  Electrical Stimulation;Moist Heat      Moist Heat Therapy   Number Minutes Moist Heat  12 Minutes    Moist Heat Location  Lumbar Spine;Hip      Electrical Stimulation   Electrical Stimulation Location  low back and into the hips    Electrical Stimulation Action  IFC    Electrical Stimulation Parameters  supine    Electrical Stimulation Goals  Pain                  PT Long Term Goals - 05/01/19 1216      PT LONG TERM GOAL #1   Title  independent with initial HEP    Time  8    Period  Weeks    Status  New      PT LONG TERM GOAL #2   Title  increase lumbar ROM 25%    Time  8    Period  Weeks    Status  New      PT LONG TERM GOAL #3   Title  decrease pain 50%    Time  8     Period  Weeks    Status  New      PT LONG TERM GOAL #4   Title  increase LE strength to 4+/5    Time  8    Period  Weeks    Status  New      PT LONG TERM GOAL #5   Title  report able to do housework without difficulty    Time  8    Period  Weeks    Status  New            Plan - 05/01/19 1041    Clinical Impression Statement  Patient reoports that about a month ago she was pulling on a drawer/shelf and had pain, the pain has continued.  She is deaf and requires sign language interpreter.  She has very poor posture with kyphosis.  She has limited lumbar ROM, LE's are weak.    Stability/Clinical Decision Making  Stable/Uncomplicated    Clinical Decision Making  Low    Rehab Potential  Good    PT Frequency  2x / week    PT Duration  8 weeks    PT Treatment/Interventions  ADLs/Self Care Home Management;Electrical Stimulation;Moist Heat;Ultrasound;Therapeutic activities;Therapeutic exercise;Balance training;Manual techniques;Patient/family education    PT Next Visit Plan  slowly start exercises for strength and flexibility    Consulted and Agree with Plan of Care  Patient       Patient will benefit from skilled therapeutic intervention in order to improve the following deficits and impairments:  Decreased range of motion, Increased muscle spasms, Decreased activity tolerance, Pain, Improper body mechanics, Impaired flexibility, Decreased mobility, Decreased strength, Postural dysfunction  Visit  Diagnosis: Acute bilateral low back pain without sciatica - Plan: PT plan of care cert/re-cert  Muscle spasm of back - Plan: PT plan of care cert/re-cert     Problem List There are no problems to display for this patient.   Sumner Boast., PT 05/01/2019, 12:22 PM  [facility] [place], [place], [address] Phone: [phone]   Fax:  [phone]  Name: Lafayette Dollard MRN: [id number] Date of Birth:  January 19, 1951

## 2019-05-01 NOTE — Patient Instructions (Signed)
Access Code: [email or web address] URL: [email or web address] Date: 05/01/2019 Prepared by: Lum Babe  Exercises Hooklying Single Knee to Chest Stretch - 2 x daily - 7 x weekly - 1 sets - 10 reps - 10 hold Supine Double Knee to Chest - 2 x daily - 7 x weekly - 1 sets - 10 reps - 10 hold Supine Lower Trunk Rotation - 2 x daily - 7 x weekly - 1 sets - 10 reps - 10 hold

## 2019-05-11 ENCOUNTER — Other Ambulatory Visit: Payer: Self-pay

## 2019-05-11 ENCOUNTER — Ambulatory Visit: Payer: Medicare Other | Admitting: Physical Therapy

## 2019-05-11 ENCOUNTER — Encounter: Payer: Self-pay | Admitting: Physical Therapy

## 2019-05-11 DIAGNOSIS — M545 Low back pain, unspecified: Secondary | ICD-10-CM

## 2019-05-11 DIAGNOSIS — M62830 Muscle spasm of back: Secondary | ICD-10-CM

## 2019-05-11 NOTE — Patient Instructions (Signed)
Access Code: [email or web address] URL: [email or web address] Date: 05/11/2019 Prepared by: Lum Babe  Exercises Supine Bridge - 3 x daily - 7 x weekly - 1 sets - 10 reps - 2 hold Seated Hamstring Stretch - 3 x daily - 7 x weekly - 1 sets - 5 reps - 30 hold

## 2019-05-11 NOTE — Therapy (Signed)
[place], [place], [address] Phone: [phone]   Fax:  [phone]  Physical Therapy Treatment  Patient Details  Name: Debbie Sanders MRN: [id number] Date of Birth: 06-07-1950 Referring Provider (PT): Belva Bertin   Encounter Date: 05/11/2019  PT End of Session - 05/11/19 1023    Visit Number  2    Date for PT Re-Evaluation  07/01/19    PT Start Time  0930    PT Stop Time  1025    PT Time Calculation (min)  55 min    Activity Tolerance  Patient tolerated treatment well    Behavior During Therapy  [facility] for tasks assessed/performed       Past Medical History:  Diagnosis Date  . Anxiety   . Cancer ([place]) 1993   B breast CA, lumpectomy per pt report  . Chronic back pain 1993   per pt  . Hypertension     Past Surgical History:  Procedure Laterality Date  . BREAST BIOPSY    . BREAST EXCISIONAL BIOPSY    . BREAST LUMPECTOMY      There were no vitals filed for this visit.  Subjective Assessment - 05/11/19 0923    Subjective  Patient reports that low back pain is 0/10 today. Pt notes that she is having pain mainly in her hip flexors, hip abductors, and glutes today. Pt states she has been doing more housework but taking rest breaks as needed for pain.    Patient is accompained by:  Interpreter   ASL interpreter   Limitations  Lifting;Standing;House hold activities    Patient Stated Goals  have less pain    Currently in Pain?  Yes    Pain Score  2     Pain Location  Back   hip flexor and abd/glute today   Pain Descriptors / Indicators  Aching    Pain Onset  1 to 4 weeks ago    Pain Frequency  Intermittent                       [facility] Adult PT Treatment/Exercise - 05/11/19 0001      Exercises   Exercises  Lumbar      Lumbar Exercises: Stretches   Active Hamstring Stretch  Right;Left;1 rep;30 seconds    Single Knee to Chest Stretch  Right;Left;30 seconds    Double Knee to  Chest Stretch  5 reps    Lower Trunk Rotation  5 reps    Pelvic Tilt  10 reps;5 seconds    Pelvic Tilt Limitations  PPT; cues for technique    Piriformis Stretch  Right;Left;30 seconds    Piriformis Stretch Limitations  supine; cues for form      Lumbar Exercises: Supine   Clam  10 reps    Clam Limitations  bilateral    Bridge  10 reps    Bridge Limitations  2 sets; cues for PPT and core activation      Moist Heat Therapy   Number Minutes Moist Heat  12 Minutes    Moist Heat Location  Other (comment)   Hip flexor and glute d/t pt reports of pain     Manual Therapy   Manual Therapy  Soft tissue mobilization    Soft tissue mobilization  STM to bilateral hip flexor, TFL, piriformis, glute             PT Education - 05/11/19 1023  Education Details  Reviewed HEP with pt. Added seated hamstring stretch and supine bridges.    Person(s) Educated  Patient    Methods  Explanation;Demonstration    Comprehension  Verbalized understanding          PT Long Term Goals - 05/01/19 1216      PT LONG TERM GOAL #1   Title  independent with initial HEP    Time  8    Period  Weeks    Status  New      PT LONG TERM GOAL #2   Title  increase lumbar ROM 25%    Time  8    Period  Weeks    Status  New      PT LONG TERM GOAL #3   Title  decrease pain 50%    Time  8    Period  Weeks    Status  New      PT LONG TERM GOAL #4   Title  increase LE strength to 4+/5    Time  8    Period  Weeks    Status  New      PT LONG TERM GOAL #5   Title  report able to do housework without difficulty    Time  8    Period  Weeks    Status  New            Plan - 05/11/19 1024    Clinical Impression Statement  Pt reports to clinic with reports of decreased LBP and increased hip flexor/abductor pain today. Pt reports LBP as 0/10 and increasing with household activities. Initiated core strengthening ex's, postural ex's, and flexibility ex's this rx. Continue to progress. STM to hip  flexor/abd/pirformis to increase flexibility and decrease pain.    PT Treatment/Interventions  ADLs/Self Care Home Management;Electrical Stimulation;Moist Heat;Ultrasound;Therapeutic activities;Therapeutic exercise;Balance training;Manual techniques;Patient/family education    PT Next Visit Plan  Continue ex's for strength and flexibility. Manual as indicated.    PT Home Exercise Plan  added hamstring stretch and supine bridges    Consulted and Agree with Plan of Care  Patient       Patient will benefit from skilled therapeutic intervention in order to improve the following deficits and impairments:  Decreased range of motion, Increased muscle spasms, Decreased activity tolerance, Pain, Improper body mechanics, Impaired flexibility, Decreased mobility, Decreased strength, Postural dysfunction  Visit Diagnosis: Acute bilateral low back pain without sciatica  Muscle spasm of back     Problem List There are no problems to display for this patient.  Amador Cunas, PT, DPT  [name] 05/11/2019, 10:35 AM  [place] [address] [place], [place], [address] Phone: [phone]   Fax:  [phone]  Name: Debbie Sanders MRN: [id number] Date of Birth: 11-15-1950

## 2019-05-14 ENCOUNTER — Ambulatory Visit: Payer: Medicare Other | Admitting: Physical Therapy

## 2019-05-14 ENCOUNTER — Other Ambulatory Visit: Payer: Self-pay

## 2019-05-14 ENCOUNTER — Encounter: Payer: Self-pay | Admitting: Physical Therapy

## 2019-05-14 DIAGNOSIS — M62830 Muscle spasm of back: Secondary | ICD-10-CM

## 2019-05-14 DIAGNOSIS — M545 Low back pain, unspecified: Secondary | ICD-10-CM

## 2019-05-14 NOTE — Therapy (Signed)
[place], [place], [address] Phone: [phone]   Fax:  [phone]  Physical Therapy Treatment  Patient Details  Name: Debbie Sanders MRN: [id number] Date of Birth: 07/03/1950 Referring Provider (PT): Belva Bertin   Encounter Date: 05/14/2019  PT End of Session - 05/14/19 1151    Visit Number  3    Date for PT Re-Evaluation  07/01/19    PT Start Time  1100    PT Stop Time  1155    PT Time Calculation (min)  55 min    Activity Tolerance  Patient tolerated treatment well    Behavior During Therapy  [facility] for tasks assessed/performed       Past Medical History:  Diagnosis Date  . Anxiety   . Cancer ([place]) 1993   B breast CA, lumpectomy per pt report  . Chronic back pain 1993   per pt  . Hypertension     Past Surgical History:  Procedure Laterality Date  . BREAST BIOPSY    . BREAST EXCISIONAL BIOPSY    . BREAST LUMPECTOMY      There were no vitals filed for this visit.  Subjective Assessment - 05/14/19 1149    Subjective  Pt reports mild hip flexor and R LBP at 1/10 today. Pt states that STM and pressure from last rx made her sore; does not want to receive STM today. She reports that the estim and heat is very helpful.    Patient is accompained by:  Interpreter   ASL   Patient Stated Goals  have less pain    Currently in Pain?  Yes    Pain Score  1     Pain Location  Back   and L hip flexor                      [facility] Adult PT Treatment/Exercise - 05/14/19 0001      Lumbar Exercises: Stretches   Double Knee to Chest Stretch  5 reps    Double Knee to Chest Stretch Limitations  with cues for slow eccentric control for core activation    Quad Stretch  Right;Left;60 seconds    Quad Stretch Limitations  prone with strap    Piriformis Stretch  Right;Left;30 seconds    Piriformis Stretch Limitations  seated cues for form      Lumbar Exercises: Aerobic   Nustep  L5 x 6 min      Lumbar Exercises: Supine   Clam  10 reps    Clam Limitations  bilateral with blue TB    Bridge  10 reps    Bridge Limitations  2 sets with blue TB and cues for PPT/core activation      Moist Heat Therapy   Number Minutes Moist Heat  12 Minutes    Moist Heat Location  Lumbar Spine   MHP on L hip flexor and glute as well     Electrical Stimulation   Electrical Stimulation Location  low back and into the hips    Electrical Stimulation Action  IFC    Electrical Stimulation Parameters  supine    Electrical Stimulation Goals  Pain      Manual Therapy   Manual therapy comments  prone quad stretch passive by PT x60min on R                  PT Long Term Goals - 05/01/19 1216  PT LONG TERM GOAL #1   Title  independent with initial HEP    Time  8    Period  Weeks    Status  New      PT LONG TERM GOAL #2   Title  increase lumbar ROM 25%    Time  8    Period  Weeks    Status  New      PT LONG TERM GOAL #3   Title  decrease pain 50%    Time  8    Period  Weeks    Status  New      PT LONG TERM GOAL #4   Title  increase LE strength to 4+/5    Time  8    Period  Weeks    Status  New      PT LONG TERM GOAL #5   Title  report able to do housework without difficulty    Time  8    Period  Weeks    Status  New            Plan - 05/14/19 1152    Clinical Impression Statement  Pt reports to clinic with reports of B hip flexor pain resolving; some pain remains L>R. Progressed core strengthening ex's. Did not perform STM d/t pt request. Pt tolerated ex's well. Continue to progress core stab; incorporate postural ex's.    PT Treatment/Interventions  ADLs/Self Care Home Management;Electrical Stimulation;Moist Heat;Ultrasound;Therapeutic activities;Therapeutic exercise;Balance training;Manual techniques;Patient/family education    PT Next Visit Plan  Continue ex's for strength and flexibility. Manual as indicated.    Consulted and Agree with Plan of Care  Patient        Patient will benefit from skilled therapeutic intervention in order to improve the following deficits and impairments:  Decreased range of motion, Increased muscle spasms, Decreased activity tolerance, Pain, Improper body mechanics, Impaired flexibility, Decreased mobility, Decreased strength, Postural dysfunction  Visit Diagnosis: Acute bilateral low back pain without sciatica  Muscle spasm of back     Problem List There are no problems to display for this patient.  Amador Cunas, PT, DPT [name] Desira Alessandrini 05/14/2019, 11:54 AM  [address] [place], [place], [address] Phone: [phone]   Fax:  [phone]  Name: Debbie Sanders MRN: [id number] Date of Birth: 1950/12/21

## 2019-05-18 ENCOUNTER — Encounter: Payer: Medicare Other | Admitting: Physical Therapy

## 2019-05-22 ENCOUNTER — Ambulatory Visit: Payer: Medicare Other | Admitting: Physical Therapy

## 2019-05-27 ENCOUNTER — Ambulatory Visit: Payer: Medicare Other | Admitting: Physical Therapy

## 2019-06-03 ENCOUNTER — Encounter: Payer: Self-pay | Admitting: Physical Therapy

## 2019-06-03 ENCOUNTER — Ambulatory Visit: Payer: Medicare Other | Attending: Family Medicine | Admitting: Physical Therapy

## 2019-06-03 ENCOUNTER — Other Ambulatory Visit: Payer: Self-pay

## 2019-06-03 DIAGNOSIS — M545 Low back pain, unspecified: Secondary | ICD-10-CM

## 2019-06-03 DIAGNOSIS — M62830 Muscle spasm of back: Secondary | ICD-10-CM | POA: Diagnosis present

## 2019-06-03 NOTE — Therapy (Signed)
[place], [place], [address] Phone: [phone]   Fax:  [phone]  Physical Therapy Treatment  Patient Details  Name: Debbie Sanders MRN: [id number] Date of Birth: 1951/01/01 Referring Provider (PT): Belva Bertin   Encounter Date: 06/03/2019  PT End of Session - 06/03/19 1520    Visit Number  4    Date for PT Re-Evaluation  07/01/19    PT Start Time  [id number]    PT Stop Time  1530    PT Time Calculation (min)  45 min    Activity Tolerance  Patient tolerated treatment well    Behavior During Therapy  [facility] for tasks assessed/performed       Past Medical History:  Diagnosis Date  . Anxiety   . Cancer ([place]) 1993   B breast CA, lumpectomy per pt report  . Chronic back pain 1993   per pt  . Hypertension     Past Surgical History:  Procedure Laterality Date  . BREAST BIOPSY    . BREAST EXCISIONAL BIOPSY    . BREAST LUMPECTOMY      There were no vitals filed for this visit.  Subjective Assessment - 06/03/19 1449    Subjective  Pt reports R LBP is better since last rx and hip flexor pain is about the same. Pt states she has been very stressed after her sister passed away ~2 weeks ago.    Patient is accompained by:  Interpreter   ASL   Currently in Pain?  Yes    Pain Score  1     Pain Location  Back    Pain Orientation  Lower                        [facility] Adult PT Treatment/Exercise - 06/03/19 0001      Lumbar Exercises: Stretches   Active Hamstring Stretch  Right;Left;1 rep;30 seconds    Piriformis Stretch  Right;Left;30 seconds    Piriformis Stretch Limitations  seated cues for form      Lumbar Exercises: Aerobic   UBE (Upper Arm Bike)  63min fwd/ 35minbkwd L3      Lumbar Exercises: Machines for Strengthening   Cybex Knee Extension  10# 1x10    Cybex Knee Flexion  20# 1x10    Leg Press  20# 2x10 assisted      Lumbar Exercises: Supine   Bridge  10 reps    Bridge  Limitations  2 sets      Moist Heat Therapy   Number Minutes Moist Heat  15 Minutes    Moist Heat Location  Lumbar Spine      Electrical Stimulation   Electrical Stimulation Location  low back and into the hips    Electrical Stimulation Action  IFC    Electrical Stimulation Parameters  supine    Electrical Stimulation Goals  Pain                  PT Long Term Goals - 05/01/19 1216      PT LONG TERM GOAL #1   Title  independent with initial HEP    Time  8    Period  Weeks    Status  New      PT LONG TERM GOAL #2   Title  increase lumbar ROM 25%    Time  8    Period  Weeks    Status  New  PT LONG TERM GOAL #3   Title  decrease pain 50%    Time  8    Period  Weeks    Status  New      PT LONG TERM GOAL #4   Title  increase LE strength to 4+/5    Time  8    Period  Weeks    Status  New      PT LONG TERM GOAL #5   Title  report able to do housework without difficulty    Time  8    Period  Weeks    Status  New            Plan - 06/03/19 1521    Clinical Impression Statement  Pt reports that RLBP is improving and L hip flexor pain is still present. Progressed LE strengthening ex's; pt tolerated well. Continue to progress core stab; incorporate postural ex's.    PT Treatment/Interventions  ADLs/Self Care Home Management;Electrical Stimulation;Moist Heat;Ultrasound;Therapeutic activities;Therapeutic exercise;Balance training;Manual techniques;Patient/family education    PT Next Visit Plan  Continue ex's for strength and flexibility. Manual as indicated.    Consulted and Agree with Plan of Care  Patient       Patient will benefit from skilled therapeutic intervention in order to improve the following deficits and impairments:  Decreased range of motion, Increased muscle spasms, Decreased activity tolerance, Pain, Improper body mechanics, Impaired flexibility, Decreased mobility, Decreased strength, Postural dysfunction  Visit Diagnosis: Acute  bilateral low back pain without sciatica  Muscle spasm of back     Problem List There are no problems to display for this patient.  Amador Cunas, PT, DPT [name] Luvenia Cranford 06/03/2019, 3:22 PM  [place] [address] [place], [place], [address] Phone: [phone]   Fax:  [phone]  Name: Debbie Sanders MRN: [id number] Date of Birth: Apr 25, 1950

## 2019-06-17 ENCOUNTER — Other Ambulatory Visit: Payer: Self-pay

## 2019-06-17 ENCOUNTER — Encounter: Payer: Self-pay | Admitting: Physical Therapy

## 2019-06-17 ENCOUNTER — Ambulatory Visit: Payer: Medicare Other | Admitting: Physical Therapy

## 2019-06-17 DIAGNOSIS — M62830 Muscle spasm of back: Secondary | ICD-10-CM

## 2019-06-17 DIAGNOSIS — M545 Low back pain, unspecified: Secondary | ICD-10-CM

## 2019-06-17 NOTE — Therapy (Signed)
[place], [place], [address] Phone: [phone]   Fax:  [phone]  Physical Therapy Treatment  Patient Details  Name: Debbie Sanders MRN: [id number] Date of Birth: [birth year] Referring Provider (PT): Belva Bertin   Encounter Date: 06/17/2019  PT End of Session - 06/17/19 [id number]    Visit Number  5    Date for PT Re-Evaluation  07/01/19    PT Start Time  1528    PT Stop Time  1624    PT Time Calculation (min)  56 min    Activity Tolerance  Patient tolerated treatment well    Behavior During Therapy  [facility] for tasks assessed/performed       Past Medical History:  Diagnosis Date  . Anxiety   . Cancer ([place]) 1993   B breast CA, lumpectomy per pt report  . Chronic back pain 1993   per pt  . Hypertension     Past Surgical History:  Procedure Laterality Date  . BREAST BIOPSY    . BREAST EXCISIONAL BIOPSY    . BREAST LUMPECTOMY      There were no vitals filed for this visit.  Subjective Assessment - 06/17/19 1525    Subjective  Pt reports she has been very stressed since the passing of her sister and that seems to be affecting her LBP.    Patient is accompained by:  Interpreter    Currently in Pain?  Yes    Pain Score  3     Pain Location  Back    Pain Orientation  Lower                        [facility] Adult PT Treatment/Exercise - 06/17/19 0001      Lumbar Exercises: Stretches   Single Knee to Chest Stretch  Right;Left;30 seconds    Single Knee to Chest Stretch Limitations  x3 each side with rotation stretch      Lumbar Exercises: Aerobic   Nustep  L3 x 8 min      Lumbar Exercises: Machines for Strengthening   Cybex Knee Extension  10# 2x10    Cybex Knee Flexion  20# 2x10    Leg Press  20# 2x10      Moist Heat Therapy   Number Minutes Moist Heat  15 Minutes    Moist Heat Location  Lumbar Spine      Electrical Stimulation   Electrical Stimulation Location  low back and  into the hips    Electrical Stimulation Action  IFC    Electrical Stimulation Parameters  supine    Electrical Stimulation Goals  Pain                  PT Long Term Goals - 05/01/19 1216      PT LONG TERM GOAL #1   Title  independent with initial HEP    Time  8    Period  Weeks    Status  New      PT LONG TERM GOAL #2   Title  increase lumbar ROM 25%    Time  8    Period  Weeks    Status  New      PT LONG TERM GOAL #3   Title  decrease pain 50%    Time  8    Period  Weeks    Status  New      PT LONG TERM  GOAL #4   Title  increase LE strength to 4+/5    Time  8    Period  Weeks    Status  New      PT LONG TERM GOAL #5   Title  report able to do housework without difficulty    Time  8    Period  Weeks    Status  New            Plan - 06/17/19 1614    Clinical Impression Statement  Pt reports that LBP is more in R LB and glute/piriformis area this rx. Pt required cuing for form with most ex's; struggled with eccentric control on leg press exercise. Pt reports relief from heat and estim.    PT Treatment/Interventions  ADLs/Self Care Home Management;Electrical Stimulation;Moist Heat;Ultrasound;Therapeutic activities;Therapeutic exercise;Balance training;Manual techniques;Patient/family education    PT Next Visit Plan  Continue ex's for strength and flexibility. Manual as indicated.    Consulted and Agree with Plan of Care  Patient       Patient will benefit from skilled therapeutic intervention in order to improve the following deficits and impairments:  Decreased range of motion, Increased muscle spasms, Decreased activity tolerance, Pain, Improper body mechanics, Impaired flexibility, Decreased mobility, Decreased strength, Postural dysfunction  Visit Diagnosis: Acute bilateral low back pain without sciatica  Muscle spasm of back     Problem List There are no problems to display for this patient.  Amador Cunas, PT, DPT [name]  [name] 06/17/2019, 4:19 PM  [place] [address] [place], [place], [address] Phone: [phone]   Fax:  [phone]  Name: Debbie Sanders MRN: [id number] Date of Birth: 1950-09-22

## 2019-06-23 ENCOUNTER — Other Ambulatory Visit: Payer: Self-pay

## 2019-06-23 ENCOUNTER — Encounter: Payer: Self-pay | Admitting: Physical Therapy

## 2019-06-23 ENCOUNTER — Ambulatory Visit: Payer: Medicare Other | Attending: Family Medicine | Admitting: Physical Therapy

## 2019-06-23 DIAGNOSIS — M545 Low back pain, unspecified: Secondary | ICD-10-CM

## 2019-06-23 DIAGNOSIS — M62830 Muscle spasm of back: Secondary | ICD-10-CM | POA: Insufficient documentation

## 2019-06-23 NOTE — Therapy (Signed)
[place], [place], [address] Phone: [phone]   Fax:  [phone]  Physical Therapy Treatment  Patient Details  Name: Debbie Sanders MRN: [id number] Date of Birth: 1950/11/05 Referring Provider (PT): Belva Bertin   Encounter Date: 06/23/2019  PT End of Session - 06/23/19 1537    Visit Number  6    Date for PT Re-Evaluation  07/01/19    PT Start Time  [id number]    PT Stop Time  1540    PT Time Calculation (min)  55 min    Activity Tolerance  Patient tolerated treatment well    Behavior During Therapy  [facility] for tasks assessed/performed       Past Medical History:  Diagnosis Date  . Anxiety   . Cancer ([place]) 1993   B breast CA, lumpectomy per pt report  . Chronic back pain 1993   per pt  . Hypertension     Past Surgical History:  Procedure Laterality Date  . BREAST BIOPSY    . BREAST EXCISIONAL BIOPSY    . BREAST LUMPECTOMY      There were no vitals filed for this visit.  Subjective Assessment - 06/23/19 1450    Subjective  Pt reports she has been improving slowly; feels like she is getting a little better.    Patient is accompained by:  Interpreter    Currently in Pain?  Yes    Pain Score  7     Pain Location  Back    Pain Orientation  Lower                        [facility] Adult PT Treatment/Exercise - 06/23/19 0001      Lumbar Exercises: Aerobic   Nustep  L5 x 7 min      Lumbar Exercises: Machines for Strengthening   Cybex Knee Extension  10# 2x10    Cybex Knee Flexion  20# 2x10    Leg Press  20# 2x10    Other Lumbar Machine Exercise  rows and lats 15# 2x10      Moist Heat Therapy   Number Minutes Moist Heat  15 Minutes    Moist Heat Location  Lumbar Spine      Electrical Stimulation   Electrical Stimulation Location  low back and into the hips    Electrical Stimulation Action  IFC    Electrical Stimulation Parameters  supine    Electrical Stimulation Goals  Pain                   PT Long Term Goals - 05/01/19 1216      PT LONG TERM GOAL #1   Title  independent with initial HEP    Time  8    Period  Weeks    Status  New      PT LONG TERM GOAL #2   Title  increase lumbar ROM 25%    Time  8    Period  Weeks    Status  New      PT LONG TERM GOAL #3   Title  decrease pain 50%    Time  8    Period  Weeks    Status  New      PT LONG TERM GOAL #4   Title  increase LE strength to 4+/5    Time  8    Period  Weeks    Status  New      PT LONG TERM GOAL #5   Title  report able to do housework without difficulty    Time  8    Period  Weeks    Status  New            Plan - 06/23/19 1538    Clinical Impression Statement  Pt continues to report slow improvement in LB and glute/piriformis pain. Pt required cuing for form with most ex's; struggled with posture on shoulder extension and rows. Pt reports relief from heat and estim.    PT Treatment/Interventions  ADLs/Self Care Home Management;Electrical Stimulation;Moist Heat;Ultrasound;Therapeutic activities;Therapeutic exercise;Balance training;Manual techniques;Patient/family education    PT Next Visit Plan  Continue ex's for strength and flexibility. Manual as indicated.    Consulted and Agree with Plan of Care  Patient       Patient will benefit from skilled therapeutic intervention in order to improve the following deficits and impairments:  Decreased range of motion, Increased muscle spasms, Decreased activity tolerance, Pain, Improper body mechanics, Impaired flexibility, Decreased mobility, Decreased strength, Postural dysfunction  Visit Diagnosis: Acute bilateral low back pain without sciatica  Muscle spasm of back     Problem List There are no problems to display for this patient.  Amador Cunas, PT, DPT [name] Jaiveon Suppes 06/23/2019, 3:39 PM  [place] [address] [place], [place], [address] Phone:  [phone]   Fax:  [phone]  Name: Debbie Sanders MRN: [id number] Date of Birth: 05/03/1950

## 2019-06-25 ENCOUNTER — Other Ambulatory Visit: Payer: Self-pay

## 2019-06-25 ENCOUNTER — Encounter: Payer: Self-pay | Admitting: Physical Therapy

## 2019-06-25 ENCOUNTER — Ambulatory Visit: Payer: Medicare Other | Admitting: Physical Therapy

## 2019-06-25 DIAGNOSIS — M545 Low back pain, unspecified: Secondary | ICD-10-CM

## 2019-06-25 DIAGNOSIS — M62830 Muscle spasm of back: Secondary | ICD-10-CM

## 2019-06-25 NOTE — Therapy (Signed)
[facility] [address] [place], [place], [address] Phone: [phone]   Fax:  [phone]  Physical Therapy Treatment  Patient Details  Name: Debbie Sanders MRN: [id number] Date of Birth: 10/24/1950 Referring Provider (PT): Belva Bertin   Encounter Date: 06/25/2019  PT End of Session - 06/25/19 1538    Visit Number  7    Date for PT Re-Evaluation  07/01/19    PT Start Time  [id number]    PT Stop Time  1540    PT Time Calculation (min)  55 min    Activity Tolerance  Patient tolerated treatment well    Behavior During Therapy  [facility] for tasks assessed/performed       Past Medical History:  Diagnosis Date  . Anxiety   . Cancer ([place]) 1993   B breast CA, lumpectomy per pt report  . Chronic back pain 1993   per pt  . Hypertension     Past Surgical History:  Procedure Laterality Date  . BREAST BIOPSY    . BREAST EXCISIONAL BIOPSY    . BREAST LUMPECTOMY      There were no vitals filed for this visit.  Subjective Assessment - 06/25/19 1450    Subjective  Pt reports she is feeling much better today; only a little pain in L hip/LB    Patient is accompained by:  Interpreter    Currently in Pain?  Yes    Pain Score  3     Pain Location  Back    Pain Orientation  Lower;Left                        [facility] Adult PT Treatment/Exercise - 06/25/19 0001      Lumbar Exercises: Stretches   Other Lumbar Stretch Exercise  doorway pec stretch 1x30 sec    Other Lumbar Stretch Exercise  fwd/lat flexion seated w/ exercise ball x3, 5 sec hold      Lumbar Exercises: Aerobic   Nustep  L5 x 7 min      Lumbar Exercises: Machines for Strengthening   Cybex Lumbar Extension  2x15 black TB    Cybex Knee Extension  10# 2x15    Cybex Knee Flexion  20# 2x15    Other Lumbar Machine Exercise  rows and lats 15# 2x15, chest press 5# 1x15      Moist Heat Therapy   Number Minutes Moist Heat  15 Minutes    Moist Heat Location  Lumbar  Spine                  PT Long Term Goals - 05/01/19 1216      PT LONG TERM GOAL #1   Title  independent with initial HEP    Time  8    Period  Weeks    Status  New      PT LONG TERM GOAL #2   Title  increase lumbar ROM 25%    Time  8    Period  Weeks    Status  New      PT LONG TERM GOAL #3   Title  decrease pain 50%    Time  8    Period  Weeks    Status  New      PT LONG TERM GOAL #4   Title  increase LE strength to 4+/5    Time  8    Period  Weeks  Status  New      PT LONG TERM GOAL #5   Title  report able to do housework without difficulty    Time  8    Period  Weeks    Status  New            Plan - 06/25/19 1538    Clinical Impression Statement  Pt continues to report improvement in LB and glute/piriformis pain. Pt requires postural and tactile cuing for most ex's. Pt reports relief from heat. Prepare for d/c at last scheduled appt.    PT Treatment/Interventions  ADLs/Self Care Home Management;Electrical Stimulation;Moist Heat;Ultrasound;Therapeutic activities;Therapeutic exercise;Balance training;Manual techniques;Patient/family education    PT Next Visit Plan  Continue ex's for strength and flexibility. Manual as indicated.    Consulted and Agree with Plan of Care  Patient       Patient will benefit from skilled therapeutic intervention in order to improve the following deficits and impairments:  Decreased range of motion, Increased muscle spasms, Decreased activity tolerance, Pain, Improper body mechanics, Impaired flexibility, Decreased mobility, Decreased strength, Postural dysfunction  Visit Diagnosis: Acute bilateral low back pain without sciatica  Muscle spasm of back     Problem List There are no problems to display for this patient.  Amador Cunas, PT, DPT [name] [name] 06/25/2019, 3:40 PM  [address] [place], [place], [address] Phone: [phone]    Fax:  [phone]  Name: Debbie Sanders MRN: [id number] Date of Birth: 01/31/1950

## 2019-07-02 ENCOUNTER — Other Ambulatory Visit: Payer: Self-pay

## 2019-07-02 ENCOUNTER — Ambulatory Visit: Payer: Medicare Other | Admitting: Physical Therapy

## 2019-07-02 ENCOUNTER — Encounter: Payer: Self-pay | Admitting: Physical Therapy

## 2019-07-02 DIAGNOSIS — M62830 Muscle spasm of back: Secondary | ICD-10-CM

## 2019-07-02 DIAGNOSIS — M545 Low back pain, unspecified: Secondary | ICD-10-CM

## 2019-07-02 NOTE — Therapy (Signed)
[place] [address] [place], [place], [address] Phone: [phone]   Fax:  [phone]  Physical Therapy Treatment  Patient Details  Name: Debbie Sanders MRN: [id number] Date of Birth: 06-Oct-1950 Referring Provider (PT): Belva Bertin   Encounter Date: 07/02/2019   PT End of Session - 07/02/19 1020    Visit Number 8    Date for PT Re-Evaluation 09/01/19    PT Start Time 0845    PT Stop Time 0930    PT Time Calculation (min) 45 min    Activity Tolerance Patient tolerated treatment well    Behavior During Therapy [facility] for tasks assessed/performed           Past Medical History:  Diagnosis Date  . Anxiety   . Cancer ([place]) 1993   B breast CA, lumpectomy per pt report  . Chronic back pain 1993   per pt  . Hypertension     Past Surgical History:  Procedure Laterality Date  . BREAST BIOPSY    . BREAST EXCISIONAL BIOPSY    . BREAST LUMPECTOMY      There were no vitals filed for this visit.   Subjective Assessment - 07/02/19 0845    Subjective Pt reports she is feeling much better overall; just a little pain in L hip/LB    Patient is accompained by: Interpreter    Currently in Pain? Yes    Pain Score 2     Pain Location Back    Pain Orientation Left;Lower                             [facility] Adult PT Treatment/Exercise - 07/02/19 0001      Lumbar Exercises: Aerobic   Recumbent Bike x6 min      Lumbar Exercises: Machines for Strengthening   Cybex Knee Extension 10# 2x15    Cybex Knee Flexion 20# 2x15    Leg Press 20# 2x15    Other Lumbar Machine Exercise rows and lats 15# 2x15, chest press 5# 1x15    Other Lumbar Machine Exercise shoulder ext 5# 2x15                       PT Long Term Goals - 05/01/19 1216      PT LONG TERM GOAL #1   Title independent with initial HEP    Time 8    Period Weeks    Status New      PT LONG TERM GOAL #2   Title increase lumbar ROM 25%     Time 8    Period Weeks    Status New      PT LONG TERM GOAL #3   Title decrease pain 50%    Time 8    Period Weeks    Status New      PT LONG TERM GOAL #4   Title increase LE strength to 4+/5    Time 8    Period Weeks    Status New      PT LONG TERM GOAL #5   Title report able to do housework without difficulty    Time 8    Period Weeks    Status New                 Plan - 07/02/19 1021    Clinical Impression Statement Pt continues to report improvement in LB pain; no increased  pain with any ex's today. Pt requires postural and tactile cuing for most ex's. Pt scheduled one more appt to check in two weeks from now and will likely d/c at that appt.    PT Treatment/Interventions ADLs/Self Care Home Management;Electrical Stimulation;Moist Heat;Ultrasound;Therapeutic activities;Therapeutic exercise;Balance training;Manual techniques;Patient/family education    PT Next Visit Plan Continue ex's for strength and flexibility. Manual as indicated.    Consulted and Agree with Plan of Care Patient           Patient will benefit from skilled therapeutic intervention in order to improve the following deficits and impairments:  Decreased range of motion, Increased muscle spasms, Decreased activity tolerance, Pain, Improper body mechanics, Impaired flexibility, Decreased mobility, Decreased strength, Postural dysfunction  Visit Diagnosis: Acute bilateral low back pain without sciatica  Muscle spasm of back     Problem List There are no problems to display for this patient.  Amador Cunas, PT, DPT [name] Osten Janek 07/02/2019, 11:03 AM  [address] [place], [place], [address] Phone: [phone]   Fax:  [phone]  Name: Debbie Sanders MRN: [id number] Date of Birth: [birth year]

## 2019-07-03 ENCOUNTER — Ambulatory Visit: Payer: Medicare Other | Admitting: Physical Therapy

## 2019-07-03 DIAGNOSIS — M62830 Muscle spasm of back: Secondary | ICD-10-CM

## 2019-07-03 DIAGNOSIS — M545 Low back pain, unspecified: Secondary | ICD-10-CM

## 2019-07-03 NOTE — Patient Instructions (Signed)
Access Code: [email or web address] URL: [email or web address] Date: 07/03/2019 Prepared by: Amador Cunas  Exercises Scapular Retraction with Resistance - 1 x daily - 7 x weekly - 10 reps - 3 sets Corner Pec Major Stretch - 1 x daily - 7 x weekly - 3 sets - 2 reps - 30 sec hold Supine Lumbar Rotation Stretch - 1 x daily - 7 x weekly - 3 sets - 5 reps - 15 sec hold Supine Single Knee to Chest Stretch - 1 x daily - 7 x weekly - 10 reps - 3 sets Supine Double Knee to Chest - 1 x daily - 7 x weekly - 10 reps - 3 sets Seated Table Hamstring Stretch - 1 x daily - 7 x weekly - 3 sets - 2 reps - 30 sec hold

## 2019-07-03 NOTE — Therapy (Signed)
[name], [place], [address] Phone: [phone]   Fax:  [phone]  Physical Therapy Treatment  Patient Details  Name: Debbie Sanders MRN: [id number] Date of Birth: 11-20-1950 Referring Provider (PT): Belva Bertin   Encounter Date: 07/03/2019   PT End of Session - 07/03/19 1027    Visit Number 9    Date for PT Re-Evaluation 09/01/19    PT Start Time 0929    PT Stop Time 1015    PT Time Calculation (min) 46 min    Activity Tolerance Patient tolerated treatment well    Behavior During Therapy [facility] for tasks assessed/performed           Past Medical History:  Diagnosis Date  . Anxiety   . Cancer ([place]) 1993   B breast CA, lumpectomy per pt report  . Chronic back pain 1993   per pt  . Hypertension     Past Surgical History:  Procedure Laterality Date  . BREAST BIOPSY    . BREAST EXCISIONAL BIOPSY    . BREAST LUMPECTOMY      There were no vitals filed for this visit.                      [facility] Adult PT Treatment/Exercise - 07/03/19 0001      Lumbar Exercises: Stretches   Other Lumbar Stretch Exercise corner pec stretch x30 sec    Other Lumbar Stretch Exercise sktc, dktc, piriformis stretch, hamstring stretch      Lumbar Exercises: Machines for Strengthening   Cybex Knee Extension 10# 1x15    Cybex Knee Flexion 20# 1x15    Other Lumbar Machine Exercise rows and lats 15# 2x15, chest press 5# 1x15    Other Lumbar Machine Exercise shoulder ext 5# 1x15      Lumbar Exercises: Standing   Scapular Retraction Both;10 reps;Theraband    Theraband Level (Scapular Retraction) Level 3 (Green)                       PT Long Term Goals - 05/01/19 1216      PT LONG TERM GOAL #1   Title independent with initial HEP    Time 8    Period Weeks    Status New      PT LONG TERM GOAL #2   Title increase lumbar ROM 25%    Time 8    Period Weeks    Status New      PT  LONG TERM GOAL #3   Title decrease pain 50%    Time 8    Period Weeks    Status New      PT LONG TERM GOAL #4   Title increase LE strength to 4+/5    Time 8    Period Weeks    Status New      PT LONG TERM GOAL #5   Title report able to do housework without difficulty    Time 8    Period Weeks    Status New                 Plan - 07/03/19 1029    Clinical Impression Statement Pt reported no increase in LBP with TE today; required postural and tactile cuing with most ex's. Updated pt HEP; pt will continue at home for next 2 weeks then follow up. Likely d/c at next rx.    PT Treatment/Interventions  ADLs/Self Care Home Management;Electrical Stimulation;Moist Heat;Ultrasound;Therapeutic activities;Therapeutic exercise;Balance training;Manual techniques;Patient/family education    PT Next Visit Plan Continue ex's for strength and flexibility. Manual as indicated.    Consulted and Agree with Plan of Care Patient           Patient will benefit from skilled therapeutic intervention in order to improve the following deficits and impairments:  Decreased range of motion, Increased muscle spasms, Decreased activity tolerance, Pain, Improper body mechanics, Impaired flexibility, Decreased mobility, Decreased strength, Postural dysfunction  Visit Diagnosis: Acute bilateral low back pain without sciatica  Muscle spasm of back     Problem List There are no problems to display for this patient.  Amador Cunas, PT, DPT [name] Diavion Labrador 07/03/2019, 10:34 AM  [address] [address] [place], [place], [address] Phone: [phone]   Fax:  [phone]  Name: Romelle Muldoon MRN: [id number] Date of Birth: [birth year]

## 2019-07-16 ENCOUNTER — Ambulatory Visit: Payer: Medicare Other | Admitting: Physical Therapy

## 2019-07-16 ENCOUNTER — Other Ambulatory Visit: Payer: Self-pay

## 2019-07-16 DIAGNOSIS — M545 Low back pain, unspecified: Secondary | ICD-10-CM

## 2019-07-16 DIAGNOSIS — M62830 Muscle spasm of back: Secondary | ICD-10-CM

## 2019-07-16 NOTE — Therapy (Signed)
[place] [address] [place], [place], [address] Phone: [phone]   Fax:  [phone]  Physical Therapy Treatment  Patient Details  Name: Domino Holten MRN: [id number] Date of Birth: 23-May-1950 Referring Provider (PT): Belva Bertin   Encounter Date: 07/16/2019   PT End of Session - 07/16/19 0939    Visit Number 10    Date for PT Re-Evaluation 09/01/19    PT Start Time 0845    PT Stop Time 0932    PT Time Calculation (min) 47 min    Activity Tolerance Patient tolerated treatment well    Behavior During Therapy [facility] for tasks assessed/performed           Past Medical History:  Diagnosis Date  . Anxiety   . Cancer ([place]) 1993   B breast CA, lumpectomy per pt report  . Chronic back pain 1993   per pt  . Hypertension     Past Surgical History:  Procedure Laterality Date  . BREAST BIOPSY    . BREAST EXCISIONAL BIOPSY    . BREAST LUMPECTOMY      There were no vitals filed for this visit.   Subjective Assessment - 07/16/19 0855    Subjective Pt reports still a little pain in L hip/LB but is feeling better overall; wants to d/c but leave chart open for a few weeks just in case she wants to check back in    Currently in Pain? Yes    Pain Score 3     Pain Location Back                             [facility] Adult PT Treatment/Exercise - 07/16/19 0001      Lumbar Exercises: Aerobic   Recumbent Bike x7 min      Lumbar Exercises: Machines for Strengthening   Cybex Knee Extension 15# 1x15    Cybex Knee Flexion 20# 2x15    Other Lumbar Machine Exercise rows and lats 15# 2x15, chest press 5# 1x15    Other Lumbar Machine Exercise shoulder ext 5# 1x15; resisted gait 20# x3 each direction CGA                       PT Long Term Goals - 07/16/19 0946      PT LONG TERM GOAL #1   Title independent with initial HEP    Time 8    Period Weeks    Status Achieved      PT LONG TERM GOAL #2    Title increase lumbar ROM 25%    Time 8    Period Weeks    Status Achieved      PT LONG TERM GOAL #3   Title decrease pain 50%    Time 8    Period Weeks    Status Achieved      PT LONG TERM GOAL #4   Title increase LE strength to 4+/5    Time 8    Period Weeks    Status Achieved      PT LONG TERM GOAL #5   Title report able to do housework without difficulty    Time 8    Period Weeks    Status Achieved                 Plan - 07/16/19 0941    Clinical Impression Statement Pt reports she is pleased  with functional goals and would like to discontinue PT; she wants to leave chart open for a few weeks just in case. Pt has met most goals, has improved posture, and had no increase in pain with exercise.    PT Treatment/Interventions ADLs/Self Care Home Management;Electrical Stimulation;Moist Heat;Ultrasound;Therapeutic activities;Therapeutic exercise;Balance training;Manual techniques;Patient/family education    PT Next Visit Plan Continue ex's for strength and flexibility. Manual as indicated.    Consulted and Agree with Plan of Care Patient           Patient will benefit from skilled therapeutic intervention in order to improve the following deficits and impairments:  Decreased range of motion, Increased muscle spasms, Decreased activity tolerance, Pain, Improper body mechanics, Impaired flexibility, Decreased mobility, Decreased strength, Postural dysfunction  Visit Diagnosis: Acute bilateral low back pain without sciatica  Muscle spasm of back     Problem List There are no problems to display for this patient.  Amador Cunas, PT, DPT [name] Meira Wahba 07/16/2019, 9:48 AM  [address] [place] [address] [place], [place], [address] Phone: [phone]   Fax:  [phone]  Name: Cataleia Gade MRN: [id number] Date of Birth: September 16, 1950

## 2020-02-05 DIAGNOSIS — K644 Residual hemorrhoidal skin tags: Secondary | ICD-10-CM | POA: Diagnosis not present

## 2020-02-05 DIAGNOSIS — R1030 Lower abdominal pain, unspecified: Secondary | ICD-10-CM | POA: Diagnosis not present

## 2020-02-09 ENCOUNTER — Other Ambulatory Visit (HOSPITAL_BASED_OUTPATIENT_CLINIC_OR_DEPARTMENT_OTHER): Payer: Self-pay | Admitting: Family Medicine

## 2020-02-09 DIAGNOSIS — R1030 Lower abdominal pain, unspecified: Secondary | ICD-10-CM

## 2020-02-11 DIAGNOSIS — H913 Deaf nonspeaking, not elsewhere classified: Secondary | ICD-10-CM | POA: Diagnosis not present

## 2020-02-11 DIAGNOSIS — M8580 Other specified disorders of bone density and structure, unspecified site: Secondary | ICD-10-CM | POA: Diagnosis not present

## 2020-02-11 DIAGNOSIS — E785 Hyperlipidemia, unspecified: Secondary | ICD-10-CM | POA: Diagnosis not present

## 2020-02-11 DIAGNOSIS — K219 Gastro-esophageal reflux disease without esophagitis: Secondary | ICD-10-CM | POA: Diagnosis not present

## 2020-02-11 DIAGNOSIS — Z23 Encounter for immunization: Secondary | ICD-10-CM | POA: Diagnosis not present

## 2020-02-11 DIAGNOSIS — I10 Essential (primary) hypertension: Secondary | ICD-10-CM | POA: Diagnosis not present

## 2020-02-17 ENCOUNTER — Other Ambulatory Visit (HOSPITAL_BASED_OUTPATIENT_CLINIC_OR_DEPARTMENT_OTHER): Payer: Self-pay | Admitting: Family Medicine

## 2020-02-17 ENCOUNTER — Other Ambulatory Visit: Payer: Self-pay

## 2020-02-17 ENCOUNTER — Ambulatory Visit (HOSPITAL_BASED_OUTPATIENT_CLINIC_OR_DEPARTMENT_OTHER)
Admission: RE | Admit: 2020-02-17 | Discharge: 2020-02-17 | Disposition: A | Discharge status: Home / Self Care | Payer: Medicare Other | Source: Ambulatory Visit | Attending: Family Medicine | Admitting: Family Medicine

## 2020-02-17 DIAGNOSIS — R1030 Lower abdominal pain, unspecified: Secondary | ICD-10-CM | POA: Diagnosis not present

## 2020-02-17 DIAGNOSIS — R1031 Right lower quadrant pain: Secondary | ICD-10-CM | POA: Diagnosis not present

## 2020-03-02 ENCOUNTER — Other Ambulatory Visit (HOSPITAL_BASED_OUTPATIENT_CLINIC_OR_DEPARTMENT_OTHER): Payer: Self-pay | Admitting: Family Medicine

## 2020-03-02 DIAGNOSIS — Z1231 Encounter for screening mammogram for malignant neoplasm of breast: Secondary | ICD-10-CM

## 2020-03-08 ENCOUNTER — Other Ambulatory Visit: Payer: Self-pay

## 2020-03-08 ENCOUNTER — Ambulatory Visit (HOSPITAL_BASED_OUTPATIENT_CLINIC_OR_DEPARTMENT_OTHER)
Admission: RE | Admit: 2020-03-08 | Discharge: 2020-03-08 | Disposition: A | Discharge status: Home / Self Care | Payer: Medicare Other | Source: Ambulatory Visit | Attending: Family Medicine | Admitting: Family Medicine

## 2020-03-08 DIAGNOSIS — Z1231 Encounter for screening mammogram for malignant neoplasm of breast: Secondary | ICD-10-CM | POA: Diagnosis not present

## 2020-05-16 DIAGNOSIS — E785 Hyperlipidemia, unspecified: Secondary | ICD-10-CM | POA: Diagnosis not present

## 2020-05-16 DIAGNOSIS — N289 Disorder of kidney and ureter, unspecified: Secondary | ICD-10-CM | POA: Diagnosis not present

## 2020-06-02 DIAGNOSIS — M8580 Other specified disorders of bone density and structure, unspecified site: Secondary | ICD-10-CM | POA: Diagnosis not present

## 2020-06-02 DIAGNOSIS — I10 Essential (primary) hypertension: Secondary | ICD-10-CM | POA: Diagnosis not present

## 2020-06-02 DIAGNOSIS — E785 Hyperlipidemia, unspecified: Secondary | ICD-10-CM | POA: Diagnosis not present

## 2020-06-02 DIAGNOSIS — K219 Gastro-esophageal reflux disease without esophagitis: Secondary | ICD-10-CM | POA: Diagnosis not present

## 2020-06-02 DIAGNOSIS — H913 Deaf nonspeaking, not elsewhere classified: Secondary | ICD-10-CM | POA: Diagnosis not present

## 2020-08-25 DIAGNOSIS — Z20822 Contact with and (suspected) exposure to covid-19: Secondary | ICD-10-CM | POA: Diagnosis not present

## 2020-08-25 DIAGNOSIS — H6983 Other specified disorders of Eustachian tube, bilateral: Secondary | ICD-10-CM | POA: Diagnosis not present

## 2020-12-05 ENCOUNTER — Other Ambulatory Visit (HOSPITAL_BASED_OUTPATIENT_CLINIC_OR_DEPARTMENT_OTHER): Payer: Self-pay | Admitting: Family Medicine

## 2020-12-05 DIAGNOSIS — I10 Essential (primary) hypertension: Secondary | ICD-10-CM | POA: Diagnosis not present

## 2020-12-05 DIAGNOSIS — H913 Deaf nonspeaking, not elsewhere classified: Secondary | ICD-10-CM | POA: Diagnosis not present

## 2020-12-05 DIAGNOSIS — M8580 Other specified disorders of bone density and structure, unspecified site: Secondary | ICD-10-CM | POA: Diagnosis not present

## 2020-12-05 DIAGNOSIS — E785 Hyperlipidemia, unspecified: Secondary | ICD-10-CM | POA: Diagnosis not present

## 2020-12-05 DIAGNOSIS — Z0000 Encounter for general adult medical examination without abnormal findings: Secondary | ICD-10-CM | POA: Diagnosis not present

## 2020-12-05 DIAGNOSIS — K219 Gastro-esophageal reflux disease without esophagitis: Secondary | ICD-10-CM | POA: Diagnosis not present

## 2020-12-05 DIAGNOSIS — Z780 Asymptomatic menopausal state: Secondary | ICD-10-CM

## 2020-12-13 ENCOUNTER — Ambulatory Visit (HOSPITAL_BASED_OUTPATIENT_CLINIC_OR_DEPARTMENT_OTHER)
Admission: RE | Admit: 2020-12-13 | Discharge: 2020-12-13 | Disposition: A | Discharge status: Home / Self Care | Payer: Medicare Other | Source: Ambulatory Visit | Attending: Family Medicine | Admitting: Family Medicine

## 2020-12-13 ENCOUNTER — Other Ambulatory Visit: Payer: Self-pay

## 2020-12-13 DIAGNOSIS — M85851 Other specified disorders of bone density and structure, right thigh: Secondary | ICD-10-CM | POA: Diagnosis not present

## 2020-12-13 DIAGNOSIS — Z780 Asymptomatic menopausal state: Secondary | ICD-10-CM | POA: Insufficient documentation

## 2021-01-03 DIAGNOSIS — H353131 Nonexudative age-related macular degeneration, bilateral, early dry stage: Secondary | ICD-10-CM | POA: Diagnosis not present

## 2021-01-03 DIAGNOSIS — H2513 Age-related nuclear cataract, bilateral: Secondary | ICD-10-CM | POA: Diagnosis not present

## 2021-01-03 DIAGNOSIS — H04123 Dry eye syndrome of bilateral lacrimal glands: Secondary | ICD-10-CM | POA: Diagnosis not present

## 2021-01-03 DIAGNOSIS — H31011 Macula scars of posterior pole (postinflammatory) (post-traumatic), right eye: Secondary | ICD-10-CM | POA: Diagnosis not present

## 2021-02-14 DIAGNOSIS — M8580 Other specified disorders of bone density and structure, unspecified site: Secondary | ICD-10-CM | POA: Diagnosis not present

## 2021-02-16 ENCOUNTER — Other Ambulatory Visit (HOSPITAL_BASED_OUTPATIENT_CLINIC_OR_DEPARTMENT_OTHER): Payer: Self-pay | Admitting: Family Medicine

## 2021-02-16 ENCOUNTER — Other Ambulatory Visit (HOSPITAL_BASED_OUTPATIENT_CLINIC_OR_DEPARTMENT_OTHER): Payer: Self-pay | Admitting: Orthopedic Surgery

## 2021-02-16 ENCOUNTER — Telehealth (HOSPITAL_BASED_OUTPATIENT_CLINIC_OR_DEPARTMENT_OTHER): Payer: Self-pay

## 2021-02-16 DIAGNOSIS — Z1231 Encounter for screening mammogram for malignant neoplasm of breast: Secondary | ICD-10-CM

## 2021-03-22 ENCOUNTER — Encounter (HOSPITAL_BASED_OUTPATIENT_CLINIC_OR_DEPARTMENT_OTHER): Payer: Self-pay

## 2021-03-22 ENCOUNTER — Other Ambulatory Visit: Payer: Self-pay

## 2021-03-22 ENCOUNTER — Ambulatory Visit (HOSPITAL_BASED_OUTPATIENT_CLINIC_OR_DEPARTMENT_OTHER)
Admission: RE | Admit: 2021-03-22 | Discharge: 2021-03-22 | Disposition: A | Discharge status: Home / Self Care | Payer: Medicare Other | Source: Ambulatory Visit | Attending: Orthopedic Surgery | Admitting: Orthopedic Surgery

## 2021-03-22 DIAGNOSIS — Z1231 Encounter for screening mammogram for malignant neoplasm of breast: Secondary | ICD-10-CM | POA: Insufficient documentation

## 2021-07-19 DIAGNOSIS — H25813 Combined forms of age-related cataract, bilateral: Secondary | ICD-10-CM | POA: Diagnosis not present

## 2021-07-19 DIAGNOSIS — H04123 Dry eye syndrome of bilateral lacrimal glands: Secondary | ICD-10-CM | POA: Diagnosis not present

## 2021-07-19 DIAGNOSIS — H353121 Nonexudative age-related macular degeneration, left eye, early dry stage: Secondary | ICD-10-CM | POA: Diagnosis not present

## 2021-07-19 DIAGNOSIS — H353112 Nonexudative age-related macular degeneration, right eye, intermediate dry stage: Secondary | ICD-10-CM | POA: Diagnosis not present

## 2021-08-08 DIAGNOSIS — H43813 Vitreous degeneration, bilateral: Secondary | ICD-10-CM | POA: Diagnosis not present

## 2021-08-08 DIAGNOSIS — H353132 Nonexudative age-related macular degeneration, bilateral, intermediate dry stage: Secondary | ICD-10-CM | POA: Diagnosis not present

## 2021-08-08 DIAGNOSIS — H25813 Combined forms of age-related cataract, bilateral: Secondary | ICD-10-CM | POA: Diagnosis not present

## 2021-08-28 ENCOUNTER — Emergency Department (HOSPITAL_BASED_OUTPATIENT_CLINIC_OR_DEPARTMENT_OTHER): Payer: Medicare Other

## 2021-08-28 ENCOUNTER — Encounter (HOSPITAL_BASED_OUTPATIENT_CLINIC_OR_DEPARTMENT_OTHER): Payer: Self-pay | Admitting: Emergency Medicine

## 2021-08-28 ENCOUNTER — Emergency Department (HOSPITAL_BASED_OUTPATIENT_CLINIC_OR_DEPARTMENT_OTHER)
Admission: EM | Admit: 2021-08-28 | Discharge: 2021-08-28 | Disposition: A | Discharge status: Home / Self Care | Payer: Medicare Other | Attending: Emergency Medicine | Admitting: Emergency Medicine

## 2021-08-28 ENCOUNTER — Other Ambulatory Visit: Payer: Self-pay

## 2021-08-28 DIAGNOSIS — R10819 Abdominal tenderness, unspecified site: Secondary | ICD-10-CM | POA: Insufficient documentation

## 2021-08-28 DIAGNOSIS — M549 Dorsalgia, unspecified: Secondary | ICD-10-CM | POA: Diagnosis not present

## 2021-08-28 DIAGNOSIS — R110 Nausea: Secondary | ICD-10-CM | POA: Diagnosis not present

## 2021-08-28 DIAGNOSIS — Z041 Encounter for examination and observation following transport accident: Secondary | ICD-10-CM | POA: Diagnosis not present

## 2021-08-28 DIAGNOSIS — I700 Atherosclerosis of aorta: Secondary | ICD-10-CM | POA: Insufficient documentation

## 2021-08-28 DIAGNOSIS — M542 Cervicalgia: Secondary | ICD-10-CM | POA: Diagnosis not present

## 2021-08-28 DIAGNOSIS — R911 Solitary pulmonary nodule: Secondary | ICD-10-CM | POA: Diagnosis not present

## 2021-08-28 DIAGNOSIS — I2510 Atherosclerotic heart disease of native coronary artery without angina pectoris: Secondary | ICD-10-CM | POA: Diagnosis not present

## 2021-08-28 DIAGNOSIS — M545 Low back pain, unspecified: Secondary | ICD-10-CM | POA: Diagnosis not present

## 2021-08-28 DIAGNOSIS — M40204 Unspecified kyphosis, thoracic region: Secondary | ICD-10-CM | POA: Diagnosis not present

## 2021-08-28 DIAGNOSIS — G8929 Other chronic pain: Secondary | ICD-10-CM | POA: Insufficient documentation

## 2021-08-28 DIAGNOSIS — M439 Deforming dorsopathy, unspecified: Secondary | ICD-10-CM | POA: Diagnosis not present

## 2021-08-28 DIAGNOSIS — S299XXA Unspecified injury of thorax, initial encounter: Secondary | ICD-10-CM | POA: Diagnosis not present

## 2021-08-28 DIAGNOSIS — G9389 Other specified disorders of brain: Secondary | ICD-10-CM | POA: Diagnosis not present

## 2021-08-28 DIAGNOSIS — S3993XA Unspecified injury of pelvis, initial encounter: Secondary | ICD-10-CM | POA: Diagnosis not present

## 2021-08-28 DIAGNOSIS — M47812 Spondylosis without myelopathy or radiculopathy, cervical region: Secondary | ICD-10-CM | POA: Diagnosis not present

## 2021-08-28 DIAGNOSIS — M2578 Osteophyte, vertebrae: Secondary | ICD-10-CM | POA: Diagnosis not present

## 2021-08-28 DIAGNOSIS — M79661 Pain in right lower leg: Secondary | ICD-10-CM | POA: Diagnosis not present

## 2021-08-28 DIAGNOSIS — Y92410 Unspecified street and highway as the place of occurrence of the external cause: Secondary | ICD-10-CM | POA: Insufficient documentation

## 2021-08-28 DIAGNOSIS — S20212A Contusion of left front wall of thorax, initial encounter: Secondary | ICD-10-CM | POA: Diagnosis not present

## 2021-08-28 DIAGNOSIS — I10 Essential (primary) hypertension: Secondary | ICD-10-CM | POA: Diagnosis not present

## 2021-08-28 DIAGNOSIS — S3991XA Unspecified injury of abdomen, initial encounter: Secondary | ICD-10-CM | POA: Diagnosis not present

## 2021-08-28 DIAGNOSIS — S199XXA Unspecified injury of neck, initial encounter: Secondary | ICD-10-CM | POA: Diagnosis not present

## 2021-08-28 DIAGNOSIS — Z7982 Long term (current) use of aspirin: Secondary | ICD-10-CM | POA: Diagnosis not present

## 2021-08-28 DIAGNOSIS — S0003XA Contusion of scalp, initial encounter: Secondary | ICD-10-CM | POA: Diagnosis not present

## 2021-08-28 DIAGNOSIS — M79662 Pain in left lower leg: Secondary | ICD-10-CM | POA: Insufficient documentation

## 2021-08-28 DIAGNOSIS — G238 Other specified degenerative diseases of basal ganglia: Secondary | ICD-10-CM | POA: Diagnosis not present

## 2021-08-28 HISTORY — DX: Unspecified hearing loss, unspecified ear: H91.90

## 2021-08-28 LAB — CBC WITH DIFFERENTIAL/PLATELET
Abs Immature Granulocytes: 0.06 10*3/uL (ref 0.00–0.07)
Basophils Absolute: 0 10*3/uL (ref 0.0–0.1)
Basophils Relative: 0 %
Eosinophils Absolute: 0.1 10*3/uL (ref 0.0–0.5)
Eosinophils Relative: 1 %
HCT: 42.9 % (ref 36.0–46.0)
Hemoglobin: 15.1 g/dL — ABNORMAL HIGH (ref 12.0–15.0)
Immature Granulocytes: 1 %
Lymphocytes Relative: 16 %
Lymphs Abs: 1.4 10*3/uL (ref 0.7–4.0)
MCH: 32.1 pg (ref 26.0–34.0)
MCHC: 35.2 g/dL (ref 30.0–36.0)
MCV: 91.1 fL (ref 80.0–100.0)
Monocytes Absolute: 0.5 10*3/uL (ref 0.1–1.0)
Monocytes Relative: 6 %
Neutro Abs: 6.5 10*3/uL (ref 1.7–7.7)
Neutrophils Relative %: 76 %
Platelets: 180 10*3/uL (ref 150–400)
RBC: 4.71 MIL/uL (ref 3.87–5.11)
RDW: 11.7 % (ref 11.5–15.5)
WBC: 8.6 10*3/uL (ref 4.0–10.5)
nRBC: 0 % (ref 0.0–0.2)

## 2021-08-28 LAB — BASIC METABOLIC PANEL
Anion gap: 8 (ref 5–15)
BUN: 18 mg/dL (ref 8–23)
CO2: 30 mmol/L (ref 22–32)
Calcium: 9.4 mg/dL (ref 8.9–10.3)
Chloride: 101 mmol/L (ref 98–111)
Creatinine, Ser: 1.01 mg/dL — ABNORMAL HIGH (ref 0.44–1.00)
GFR, Estimated: 59 mL/min — ABNORMAL LOW (ref >60)
Glucose, Bld: 128 mg/dL — ABNORMAL HIGH (ref 70–99)
Potassium: 4.3 mmol/L (ref 3.5–5.1)
Sodium: 139 mmol/L (ref 135–145)

## 2021-08-28 MED ORDER — ONDANSETRON 4 MG PO TBDP
8.0000 mg | ORAL_TABLET | Freq: Once | ORAL | Status: DC
  Filled 2021-08-28: qty 2

## 2021-08-28 MED ORDER — ONDANSETRON HCL 4 MG/2ML IJ SOLN
INTRAMUSCULAR | Status: AC
  Administered 2021-08-28: 4 mg
  Filled 2021-08-28: qty 2

## 2021-08-28 MED ORDER — FENTANYL CITRATE PF 50 MCG/ML IJ SOSY
50.0000 ug | PREFILLED_SYRINGE | Freq: Once | INTRAMUSCULAR | Status: AC
  Administered 2021-08-28: 25 ug via INTRAVENOUS
  Filled 2021-08-28: qty 1

## 2021-08-28 MED ORDER — IOHEXOL 300 MG/ML  SOLN
100.0000 mL | Freq: Once | INTRAMUSCULAR | Status: AC | PRN
  Administered 2021-08-28: 100 mL via INTRAVENOUS

## 2021-08-28 MED ORDER — OXYCODONE-ACETAMINOPHEN 5-325 MG PO TABS: 1.0000 | ORAL_TABLET | Freq: Four times a day (QID) | ORAL | 0 refills | Status: AC | PRN

## 2021-08-28 NOTE — ED Notes (Signed)
Patient Alert and oriented to baseline. Stable and ambulatory to baseline. Patient verbalized understanding of the discharge instructions.  ASL interpreter used to go over discharge instructions. Patient belongings were taken by the patient.
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
## 2021-08-29 ENCOUNTER — Telehealth (HOSPITAL_BASED_OUTPATIENT_CLINIC_OR_DEPARTMENT_OTHER): Payer: Self-pay | Admitting: Emergency Medicine

## 2021-09-20 DIAGNOSIS — S32009D Unspecified fracture of unspecified lumbar vertebra, subsequent encounter for fracture with routine healing: Secondary | ICD-10-CM | POA: Diagnosis not present

## 2021-09-20 DIAGNOSIS — M419 Scoliosis, unspecified: Secondary | ICD-10-CM | POA: Diagnosis not present

## 2021-11-06 DIAGNOSIS — J309 Allergic rhinitis, unspecified: Secondary | ICD-10-CM | POA: Diagnosis not present

## 2021-11-06 DIAGNOSIS — R238 Other skin changes: Secondary | ICD-10-CM | POA: Diagnosis not present

## 2021-11-14 DIAGNOSIS — Z79899 Other long term (current) drug therapy: Secondary | ICD-10-CM | POA: Diagnosis not present

## 2021-11-14 DIAGNOSIS — H353121 Nonexudative age-related macular degeneration, left eye, early dry stage: Secondary | ICD-10-CM | POA: Diagnosis not present

## 2021-11-14 DIAGNOSIS — H913 Deaf nonspeaking, not elsewhere classified: Secondary | ICD-10-CM | POA: Diagnosis not present

## 2021-11-14 DIAGNOSIS — H25813 Combined forms of age-related cataract, bilateral: Secondary | ICD-10-CM | POA: Diagnosis not present

## 2021-11-14 DIAGNOSIS — H25812 Combined forms of age-related cataract, left eye: Secondary | ICD-10-CM | POA: Diagnosis not present

## 2021-11-14 DIAGNOSIS — K219 Gastro-esophageal reflux disease without esophagitis: Secondary | ICD-10-CM | POA: Diagnosis not present

## 2021-11-14 DIAGNOSIS — E785 Hyperlipidemia, unspecified: Secondary | ICD-10-CM | POA: Diagnosis not present

## 2021-11-14 DIAGNOSIS — H353112 Nonexudative age-related macular degeneration, right eye, intermediate dry stage: Secondary | ICD-10-CM | POA: Diagnosis not present

## 2021-11-14 DIAGNOSIS — M1990 Unspecified osteoarthritis, unspecified site: Secondary | ICD-10-CM | POA: Diagnosis not present

## 2021-11-14 DIAGNOSIS — I10 Essential (primary) hypertension: Secondary | ICD-10-CM | POA: Diagnosis not present

## 2021-11-14 DIAGNOSIS — H04123 Dry eye syndrome of bilateral lacrimal glands: Secondary | ICD-10-CM | POA: Diagnosis not present

## 2021-11-21 DIAGNOSIS — H04123 Dry eye syndrome of bilateral lacrimal glands: Secondary | ICD-10-CM | POA: Diagnosis not present

## 2021-11-21 DIAGNOSIS — M1990 Unspecified osteoarthritis, unspecified site: Secondary | ICD-10-CM | POA: Diagnosis not present

## 2021-11-21 DIAGNOSIS — K219 Gastro-esophageal reflux disease without esophagitis: Secondary | ICD-10-CM | POA: Diagnosis not present

## 2021-11-21 DIAGNOSIS — Z79899 Other long term (current) drug therapy: Secondary | ICD-10-CM | POA: Diagnosis not present

## 2021-11-21 DIAGNOSIS — H25811 Combined forms of age-related cataract, right eye: Secondary | ICD-10-CM | POA: Diagnosis not present

## 2021-11-21 DIAGNOSIS — H353121 Nonexudative age-related macular degeneration, left eye, early dry stage: Secondary | ICD-10-CM | POA: Diagnosis not present

## 2021-11-21 DIAGNOSIS — E785 Hyperlipidemia, unspecified: Secondary | ICD-10-CM | POA: Diagnosis not present

## 2021-11-21 DIAGNOSIS — I10 Essential (primary) hypertension: Secondary | ICD-10-CM | POA: Diagnosis not present

## 2021-11-21 DIAGNOSIS — H25813 Combined forms of age-related cataract, bilateral: Secondary | ICD-10-CM | POA: Diagnosis not present

## 2021-11-21 DIAGNOSIS — J309 Allergic rhinitis, unspecified: Secondary | ICD-10-CM | POA: Diagnosis not present

## 2021-12-19 DIAGNOSIS — H25812 Combined forms of age-related cataract, left eye: Secondary | ICD-10-CM | POA: Diagnosis not present

## 2021-12-19 DIAGNOSIS — H25811 Combined forms of age-related cataract, right eye: Secondary | ICD-10-CM | POA: Diagnosis not present

## 2021-12-21 DIAGNOSIS — R238 Other skin changes: Secondary | ICD-10-CM | POA: Diagnosis not present

## 2021-12-21 DIAGNOSIS — I10 Essential (primary) hypertension: Secondary | ICD-10-CM | POA: Diagnosis not present

## 2021-12-21 DIAGNOSIS — Z6823 Body mass index (BMI) 23.0-23.9, adult: Secondary | ICD-10-CM | POA: Diagnosis not present

## 2021-12-21 DIAGNOSIS — K219 Gastro-esophageal reflux disease without esophagitis: Secondary | ICD-10-CM | POA: Diagnosis not present

## 2021-12-21 DIAGNOSIS — E785 Hyperlipidemia, unspecified: Secondary | ICD-10-CM | POA: Diagnosis not present

## 2021-12-21 DIAGNOSIS — M8580 Other specified disorders of bone density and structure, unspecified site: Secondary | ICD-10-CM | POA: Diagnosis not present

## 2021-12-21 DIAGNOSIS — H913 Deaf nonspeaking, not elsewhere classified: Secondary | ICD-10-CM | POA: Diagnosis not present

## 2021-12-21 DIAGNOSIS — Z0000 Encounter for general adult medical examination without abnormal findings: Secondary | ICD-10-CM | POA: Diagnosis not present

## 2022-01-02 DIAGNOSIS — Z23 Encounter for immunization: Secondary | ICD-10-CM | POA: Diagnosis not present

## 2022-02-22 ENCOUNTER — Telehealth (HOSPITAL_BASED_OUTPATIENT_CLINIC_OR_DEPARTMENT_OTHER): Payer: Self-pay

## 2022-02-23 ENCOUNTER — Other Ambulatory Visit (HOSPITAL_BASED_OUTPATIENT_CLINIC_OR_DEPARTMENT_OTHER): Payer: Self-pay | Admitting: Orthopedic Surgery

## 2022-02-23 DIAGNOSIS — Z1231 Encounter for screening mammogram for malignant neoplasm of breast: Secondary | ICD-10-CM

## 2022-04-03 ENCOUNTER — Encounter (HOSPITAL_BASED_OUTPATIENT_CLINIC_OR_DEPARTMENT_OTHER): Payer: Self-pay

## 2022-04-03 ENCOUNTER — Ambulatory Visit (HOSPITAL_BASED_OUTPATIENT_CLINIC_OR_DEPARTMENT_OTHER)
Admission: RE | Admit: 2022-04-03 | Discharge: 2022-04-03 | Disposition: A | Discharge status: Home / Self Care | Payer: Medicare Other | Source: Ambulatory Visit | Attending: Orthopedic Surgery | Admitting: Orthopedic Surgery

## 2022-04-03 DIAGNOSIS — Z1231 Encounter for screening mammogram for malignant neoplasm of breast: Secondary | ICD-10-CM | POA: Diagnosis not present

## 2022-04-09 ENCOUNTER — Encounter (HOSPITAL_BASED_OUTPATIENT_CLINIC_OR_DEPARTMENT_OTHER): Payer: Self-pay | Admitting: Family Medicine

## 2022-04-09 ENCOUNTER — Encounter (HOSPITAL_BASED_OUTPATIENT_CLINIC_OR_DEPARTMENT_OTHER): Payer: Self-pay | Admitting: Orthopedic Surgery

## 2022-06-04 DIAGNOSIS — M79671 Pain in right foot: Secondary | ICD-10-CM | POA: Diagnosis not present

## 2022-06-04 DIAGNOSIS — H938X3 Other specified disorders of ear, bilateral: Secondary | ICD-10-CM | POA: Diagnosis not present

## 2022-06-19 DIAGNOSIS — J069 Acute upper respiratory infection, unspecified: Secondary | ICD-10-CM | POA: Diagnosis not present

## 2022-06-21 ENCOUNTER — Ambulatory Visit: Payer: Medicare Other | Admitting: Podiatry

## 2022-06-21 ENCOUNTER — Encounter: Payer: Self-pay | Admitting: Podiatry

## 2022-06-21 ENCOUNTER — Ambulatory Visit (INDEPENDENT_AMBULATORY_CARE_PROVIDER_SITE_OTHER): Payer: Medicare Other

## 2022-06-21 DIAGNOSIS — M79672 Pain in left foot: Secondary | ICD-10-CM

## 2022-06-21 DIAGNOSIS — M205X1 Other deformities of toe(s) (acquired), right foot: Secondary | ICD-10-CM

## 2022-06-21 DIAGNOSIS — M21611 Bunion of right foot: Secondary | ICD-10-CM

## 2022-06-21 DIAGNOSIS — M79671 Pain in right foot: Secondary | ICD-10-CM | POA: Diagnosis not present

## 2022-06-21 NOTE — Progress Notes (Signed)
  Subjective:  Patient ID: Debbie Sanders, female    DOB: Jan 30, 1950,   MRN: [id number]  Chief Complaint  Patient presents with   Foot Pain    Right foot care    72 y.o. female presents for concern of right foot pain that has been going on for about 4 months. Relates pain when wearing certain shoes and pressure on the area. Relates the pain is on the inside of her foot and the inside of her great toe.  . Denies any other pedal complaints. Denies n/v/f/c.   Past Medical History:  Diagnosis Date   Anxiety    Cancer (HCC) 1993   B breast CA, lumpectomy per pt report   Chronic back pain 1993   per pt   Deaf    Hypertension     Objective:  Physical Exam: Vascular: DP/PT pulses 2/4 bilateral. CFT <3 seconds. Normal hair growth on digits. No edema.  Skin. No lacerations or abrasions bilateral feet.  Musculoskeletal: MMT 5/5 bilateral lower extremities in DF, PF, Inversion and Eversion. Deceased ROM in DF of ankle joint. 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 Mostly pain with end Rom of the first MPJ.  Neurological: Sensation intact to light touch.   Assessment:   1. Bunion, right foot   2. Hallux limitus, right      Plan:  Patient was evaluated and treated and all questions answered. -Xrays reviewed. Moderate HAV deformity noted with IM 1-2 angle of about 14 degreees.  -Discussed HAV and treatment options;conservative and surgical management; risks, benefits, alternatives discussed. All patient's questions answered. -Discussed padding and wide shoe gear.   -Recommend continue with good supportive shoes and inserts.  -Discussed surgical options.  -Patient to return to office as needed or sooner if condition worsens.   Louann Sjogren, DPM

## 2022-10-18 DIAGNOSIS — K219 Gastro-esophageal reflux disease without esophagitis: Secondary | ICD-10-CM | POA: Diagnosis not present

## 2022-10-18 DIAGNOSIS — R1311 Dysphagia, oral phase: Secondary | ICD-10-CM | POA: Diagnosis not present

## 2022-10-18 DIAGNOSIS — H9190 Unspecified hearing loss, unspecified ear: Secondary | ICD-10-CM | POA: Diagnosis not present

## 2022-11-07 DIAGNOSIS — K319 Disease of stomach and duodenum, unspecified: Secondary | ICD-10-CM | POA: Diagnosis not present

## 2022-11-07 DIAGNOSIS — K449 Diaphragmatic hernia without obstruction or gangrene: Secondary | ICD-10-CM | POA: Diagnosis not present

## 2022-11-07 DIAGNOSIS — K2970 Gastritis, unspecified, without bleeding: Secondary | ICD-10-CM | POA: Diagnosis not present

## 2022-11-07 DIAGNOSIS — K219 Gastro-esophageal reflux disease without esophagitis: Secondary | ICD-10-CM | POA: Diagnosis not present

## 2022-11-28 DIAGNOSIS — H3530 Unspecified macular degeneration: Secondary | ICD-10-CM | POA: Diagnosis not present

## 2022-12-26 DIAGNOSIS — Z0000 Encounter for general adult medical examination without abnormal findings: Secondary | ICD-10-CM | POA: Diagnosis not present

## 2022-12-26 DIAGNOSIS — M21611 Bunion of right foot: Secondary | ICD-10-CM | POA: Diagnosis not present

## 2022-12-26 DIAGNOSIS — K219 Gastro-esophageal reflux disease without esophagitis: Secondary | ICD-10-CM | POA: Diagnosis not present

## 2022-12-26 DIAGNOSIS — I700 Atherosclerosis of aorta: Secondary | ICD-10-CM | POA: Diagnosis not present

## 2022-12-26 DIAGNOSIS — Z6823 Body mass index (BMI) 23.0-23.9, adult: Secondary | ICD-10-CM | POA: Diagnosis not present

## 2022-12-26 DIAGNOSIS — M8580 Other specified disorders of bone density and structure, unspecified site: Secondary | ICD-10-CM | POA: Diagnosis not present

## 2022-12-26 DIAGNOSIS — E785 Hyperlipidemia, unspecified: Secondary | ICD-10-CM | POA: Diagnosis not present

## 2022-12-26 DIAGNOSIS — I10 Essential (primary) hypertension: Secondary | ICD-10-CM | POA: Diagnosis not present

## 2022-12-28 ENCOUNTER — Other Ambulatory Visit (HOSPITAL_BASED_OUTPATIENT_CLINIC_OR_DEPARTMENT_OTHER): Payer: Self-pay | Admitting: Family Medicine

## 2022-12-28 DIAGNOSIS — Z13820 Encounter for screening for osteoporosis: Secondary | ICD-10-CM

## 2023-03-05 ENCOUNTER — Other Ambulatory Visit (HOSPITAL_BASED_OUTPATIENT_CLINIC_OR_DEPARTMENT_OTHER): Payer: Self-pay | Admitting: Orthopedic Surgery

## 2023-03-05 DIAGNOSIS — Z139 Encounter for screening, unspecified: Secondary | ICD-10-CM

## 2023-04-09 ENCOUNTER — Ambulatory Visit (HOSPITAL_BASED_OUTPATIENT_CLINIC_OR_DEPARTMENT_OTHER)
Admission: RE | Admit: 2023-04-09 | Discharge: 2023-04-09 | Disposition: A | Discharge status: Home / Self Care | Payer: Medicare Other | Source: Ambulatory Visit | Attending: Orthopedic Surgery | Admitting: Orthopedic Surgery

## 2023-04-09 ENCOUNTER — Encounter (HOSPITAL_BASED_OUTPATIENT_CLINIC_OR_DEPARTMENT_OTHER): Payer: Self-pay

## 2023-04-09 DIAGNOSIS — Z1231 Encounter for screening mammogram for malignant neoplasm of breast: Secondary | ICD-10-CM | POA: Insufficient documentation

## 2023-04-09 DIAGNOSIS — Z139 Encounter for screening, unspecified: Secondary | ICD-10-CM

## 2023-07-18 DIAGNOSIS — F411 Generalized anxiety disorder: Secondary | ICD-10-CM | POA: Diagnosis not present

## 2023-07-18 DIAGNOSIS — I10 Essential (primary) hypertension: Secondary | ICD-10-CM | POA: Diagnosis not present

## 2023-07-18 DIAGNOSIS — M1990 Unspecified osteoarthritis, unspecified site: Secondary | ICD-10-CM | POA: Diagnosis not present

## 2023-07-18 DIAGNOSIS — E785 Hyperlipidemia, unspecified: Secondary | ICD-10-CM | POA: Diagnosis not present

## 2023-07-18 DIAGNOSIS — M810 Age-related osteoporosis without current pathological fracture: Secondary | ICD-10-CM | POA: Diagnosis not present

## 2023-07-18 DIAGNOSIS — Z008 Encounter for other general examination: Secondary | ICD-10-CM | POA: Diagnosis not present

## 2023-07-18 DIAGNOSIS — R2681 Unsteadiness on feet: Secondary | ICD-10-CM | POA: Diagnosis not present

## 2023-12-17 ENCOUNTER — Ambulatory Visit (HOSPITAL_BASED_OUTPATIENT_CLINIC_OR_DEPARTMENT_OTHER)
Admission: RE | Admit: 2023-12-17 | Discharge: 2023-12-17 | Disposition: A | Discharge status: Home / Self Care | Payer: Medicare Other | Source: Ambulatory Visit | Attending: Family Medicine | Admitting: Family Medicine

## 2023-12-17 DIAGNOSIS — Z13820 Encounter for screening for osteoporosis: Secondary | ICD-10-CM | POA: Insufficient documentation

## 2023-12-17 DIAGNOSIS — Z780 Asymptomatic menopausal state: Secondary | ICD-10-CM | POA: Diagnosis not present

## 2023-12-17 DIAGNOSIS — M8589 Other specified disorders of bone density and structure, multiple sites: Secondary | ICD-10-CM | POA: Diagnosis not present
